# Patient Record
Sex: Male | Born: 1960 | Race: White | Hispanic: No | Marital: Single | Smoking: Current every day smoker
Health system: Southern US, Community
[De-identification: ages and names within clinical notes are randomized; demographics above are authoritative.]

## PROBLEM LIST (undated history)

## (undated) DIAGNOSIS — G629 Polyneuropathy, unspecified: Secondary | ICD-10-CM

## (undated) DIAGNOSIS — I2699 Other pulmonary embolism without acute cor pulmonale: Secondary | ICD-10-CM

## (undated) DIAGNOSIS — M1990 Unspecified osteoarthritis, unspecified site: Secondary | ICD-10-CM

## (undated) DIAGNOSIS — I10 Essential (primary) hypertension: Secondary | ICD-10-CM

## (undated) DIAGNOSIS — R011 Cardiac murmur, unspecified: Secondary | ICD-10-CM

## (undated) DIAGNOSIS — I82409 Acute embolism and thrombosis of unspecified deep veins of unspecified lower extremity: Secondary | ICD-10-CM

## (undated) DIAGNOSIS — F419 Anxiety disorder, unspecified: Secondary | ICD-10-CM

## (undated) DIAGNOSIS — K219 Gastro-esophageal reflux disease without esophagitis: Secondary | ICD-10-CM

## (undated) DIAGNOSIS — I4891 Unspecified atrial fibrillation: Secondary | ICD-10-CM

## (undated) DIAGNOSIS — I209 Angina pectoris, unspecified: Secondary | ICD-10-CM

## (undated) HISTORY — PX: HERNIA REPAIR: SHX51

## (undated) HISTORY — PX: CHOLECYSTECTOMY: SHX55

## (undated) HISTORY — PX: GANGLION CYST EXCISION: SHX1691

## (undated) HISTORY — DX: Unspecified atrial fibrillation: I48.91

## (undated) HISTORY — PX: OTHER SURGICAL HISTORY: SHX169

## (undated) HISTORY — PX: CARPAL TUNNEL RELEASE: SHX101

---

## 2008-03-26 ENCOUNTER — Encounter: Payer: Self-pay | Admitting: Pulmonary Disease

## 2008-03-27 ENCOUNTER — Encounter: Payer: Self-pay | Admitting: Pulmonary Disease

## 2008-09-22 ENCOUNTER — Encounter: Payer: Self-pay | Admitting: Pulmonary Disease

## 2008-09-27 ENCOUNTER — Ambulatory Visit: Payer: Self-pay | Admitting: Cardiovascular Disease

## 2008-09-27 ENCOUNTER — Ambulatory Visit: Payer: Self-pay | Admitting: Pulmonary Disease

## 2008-09-27 DIAGNOSIS — E785 Hyperlipidemia, unspecified: Secondary | ICD-10-CM

## 2008-09-27 DIAGNOSIS — I2699 Other pulmonary embolism without acute cor pulmonale: Secondary | ICD-10-CM | POA: Insufficient documentation

## 2008-09-30 ENCOUNTER — Encounter (INDEPENDENT_AMBULATORY_CARE_PROVIDER_SITE_OTHER): Payer: Self-pay | Admitting: *Deleted

## 2022-04-02 NOTE — Progress Notes (Addendum)
Anesthesia Review:  PCP: DR Baird Lyons sharp  LOV 03/2022  Cardiologist : ?  Chest x-ray : EKG : 04/10/22  Echo : Stress test: Cardiac Cath :  Activity level: can do a flight of stairs without difficulty  Sleep Study/ CPAP : none  Fasting Blood Sugar :      / Checks Blood Sugar -- times a day:   Blood Thinner/ Instructions /Last Dose: ASA / Instructions/ Last Dose :   0801AM ON 04/10/22- NO ANSWER AT [phone] NUMBER.  VOICE MAIOL NOT SET UP ON 250 NUMBER AND 685 NUMBER- NOT IN SERVICE.  PT NOT AT FRONT DESK IN ADMITTING.   PT was 30 minutes late for preop appt.   Hx of blood clots and 2 pulmonary emboli per pt not seen by cardiolgist in years.   Pt on eliquis and 81 mg Aspirin   PT had some chest paih on 04/04/2022 per pt - ? Heartburn  PT also describes at times- " nipples hurt"  PT stated at end of preop appt was nauseated this am after eating biscuit prior to arrival and owke up with sweat on bed.  PT states " I wonder if I have a sinus infections".  Blood pressure was 177/75 at preop appt and pulse was 126.  Ekg done.   Atrial flutter .  Leticia Clas made aware of above.  Kathi Ludwig Ward,PA saw pt and spoke with pt.   Pt instructed to call pharmacy at [phone] andgive them list of meds.  Reportd called to Charge Nurse in ED.  Instructed to bring to room 6 in ED.  PT transported to room 6 via wheelchair.  PT voices no complaints.  PT allowed to call fiancee.  Taken to ED .  Report given to nurse.  PT discharged from the ED after receivin a dose if IV Metoprolol.  PT understand s to followup with PCP.   CBC done 04/10/22 routed to Dr Charlann Boxer.,

## 2022-04-02 NOTE — Progress Notes (Signed)
DUE TO COVID-19 ONLY  2 VISITOR IS ALLOWED TO COME WITH YOU AND STAY IN THE WAITING ROOM ONLY DURING PRE OP AND PROCEDURE DAY OF SURGERY.  4  VISITOR  MAY VISIT WITH YOU AFTER SURGERY IN YOUR PRIVATE ROOM DURING VISITING HOURS ONLY! YOU MAY HAVE ONE PERSON SPEND THE NITE WITH YOU IN YOUR ROOM AFTER SURGERY.     Your procedure is scheduled on:   04/23/22   Report to [facility] Main  Entrance   Report to admitting at    1015             AM DO NOT BRING INSURANCE CARD, PICTURE ID OR WALLET DAY OF SURGERY.      Call this number if you have problems the morning of surgery [phone]    REMEMBER: NO  SOLID FOODS , CANDY, GUM OR MINTS AFTER [place] .       Marland Kitchen CLEAR LIQUIDS UNTIL      0945am          DAY OF SURGERY.      PLEASE FINISH ENSURE DRINK PER SURGEON ORDER  WHICH NEEDS TO BE COMPLETED AT       0945am    MORNING OF SURGERY.       CLEAR LIQUID DIET   Foods Allowed      WATER BLACK COFFEE ( SUGAR OK, NO MILK, CREAM OR CREAMER) REGULAR AND DECAF  TEA ( SUGAR OK NO MILK, CREAM, OR CREAMER) REGULAR AND DECAF  PLAIN JELLO ( NO RED)  FRUIT ICES ( NO RED, NO FRUIT PULP)  POPSICLES ( NO RED)  JUICE- APPLE, WHITE GRAPE AND WHITE CRANBERRY  SPORT DRINK LIKE GATORADE ( NO RED)  CLEAR BROTH ( VEGETABLE , CHICKEN OR BEEF)                                                                     BRUSH YOUR TEETH MORNING OF SURGERY AND RINSE YOUR MOUTH OUT, NO CHEWING GUM CANDY OR MINTS.     Take these medicines the morning of surgery with A SIP OF WATER:    DO NOT TAKE ANY DIABETIC MEDICATIONS DAY OF YOUR SURGERY                               You may not have any metal on your body including hair pins and              piercings  Do not wear jewelry, make-up, lotions, powders or perfumes, deodorant             Do not wear nail polish on your fingernails.              IF YOU ARE A MALE AND WANT TO SHAVE UNDER ARMS OR LEGS PRIOR TO SURGERY YOU MUST DO SO AT  LEAST 48 HOURS PRIOR TO SURGERY.              Men may shave face and neck.   Do not bring valuables to the hospital. [place].  Contacts,  dentures or bridgework may not be worn into surgery.  Leave suitcase in the car. After surgery it may be brought to your room.     Patients discharged the day of surgery will not be allowed to drive home. IF YOU ARE HAVING SURGERY AND GOING HOME THE SAME DAY, YOU MUST HAVE AN ADULT TO DRIVE YOU HOME AND BE WITH YOU FOR 24 HOURS. YOU MAY GO HOME BY TAXI OR UBER OR ORTHERWISE, BUT AN ADULT MUST ACCOMPANY YOU HOME AND STAY WITH YOU FOR 24 HOURS.                Please read over the following fact sheets you were given: _____________________________________________________________________  [facility] - Preparing for Surgery Before surgery, you can play an important role.  Because skin is not sterile, your skin needs to be as free of germs as possible.  You can reduce the number of germs on your skin by washing with CHG (chlorahexidine gluconate) soap before surgery.  CHG is an antiseptic cleaner which kills germs and bonds with the skin to continue killing germs even after washing. Please DO NOT use if you have an allergy to CHG or antibacterial soaps.  If your skin becomes reddened/irritated stop using the CHG and inform your nurse when you arrive at Short Stay. Do not shave (including legs and underarms) for at least 48 hours prior to the first CHG shower.  You may shave your face/neck. Please follow these instructions carefully:  1.  Shower with CHG Soap the night before surgery and the  morning of Surgery.  2.  If you choose to wash your hair, wash your hair first as usual with your  normal  shampoo.  3.  After you shampoo, rinse your hair and body thoroughly to remove the  shampoo.                           4.  Use CHG as you would any other liquid soap.  You can apply chg directly  to the skin and wash                        Gently with a scrungie or clean washcloth.  5.  Apply the CHG Soap to your body ONLY FROM THE NECK DOWN.   Do not use on face/ open                           Wound or open sores. Avoid contact with eyes, ears mouth and genitals (private parts).                       Wash face,  Genitals (private parts) with your normal soap.             6.  Wash thoroughly, paying special attention to the area where your surgery  will be performed.  7.  Thoroughly rinse your body with warm water from the neck down.  8.  DO NOT shower/wash with your normal soap after using and rinsing off  the CHG Soap.                9.  Pat yourself dry with a clean towel.            10.  Wear clean pajamas.            11.  Place  clean sheets on your bed the night of your first shower and do not  sleep with pets. Day of Surgery : Do not apply any lotions/deodorants the morning of surgery.  Please wear clean clothes to the hospital/surgery center.  FAILURE TO FOLLOW THESE INSTRUCTIONS MAY RESULT IN THE CANCELLATION OF YOUR SURGERY PATIENT SIGNATURE_________________________________  NURSE SIGNATURE__________________________________  ________________________________________________________________________

## 2022-04-10 ENCOUNTER — Encounter (HOSPITAL_COMMUNITY): Payer: Self-pay | Admitting: Physician Assistant

## 2022-04-10 ENCOUNTER — Other Ambulatory Visit: Payer: Self-pay

## 2022-04-10 ENCOUNTER — Encounter (HOSPITAL_COMMUNITY)
Admission: RE | Admit: 2022-04-10 | Discharge: 2022-04-10 | Disposition: A | Discharge status: Home / Self Care | Payer: BC Managed Care – PPO | Source: Ambulatory Visit | Attending: Orthopedic Surgery | Admitting: Orthopedic Surgery

## 2022-04-10 ENCOUNTER — Encounter (HOSPITAL_COMMUNITY): Payer: Self-pay

## 2022-04-10 ENCOUNTER — Emergency Department (HOSPITAL_COMMUNITY): Payer: BC Managed Care – PPO

## 2022-04-10 ENCOUNTER — Encounter (INDEPENDENT_AMBULATORY_CARE_PROVIDER_SITE_OTHER): Payer: Self-pay

## 2022-04-10 ENCOUNTER — Emergency Department (HOSPITAL_COMMUNITY)
Admission: EM | Admit: 2022-04-10 | Discharge: 2022-04-10 | Disposition: A | Discharge status: Home / Self Care | Payer: BC Managed Care – PPO | Attending: Emergency Medicine | Admitting: Emergency Medicine

## 2022-04-10 VITALS — BP 177/75 | HR 126 | Temp 98.6°F | Resp 16 | Ht 68.0 in | Wt 210.0 lb

## 2022-04-10 DIAGNOSIS — I10 Essential (primary) hypertension: Secondary | ICD-10-CM | POA: Diagnosis not present

## 2022-04-10 DIAGNOSIS — I4892 Unspecified atrial flutter: Secondary | ICD-10-CM | POA: Diagnosis present

## 2022-04-10 DIAGNOSIS — Z01818 Encounter for other preprocedural examination: Secondary | ICD-10-CM | POA: Insufficient documentation

## 2022-04-10 DIAGNOSIS — M1712 Unilateral primary osteoarthritis, left knee: Secondary | ICD-10-CM | POA: Insufficient documentation

## 2022-04-10 DIAGNOSIS — Z7901 Long term (current) use of anticoagulants: Secondary | ICD-10-CM | POA: Insufficient documentation

## 2022-04-10 DIAGNOSIS — I483 Typical atrial flutter: Secondary | ICD-10-CM | POA: Insufficient documentation

## 2022-04-10 HISTORY — DX: Gastro-esophageal reflux disease without esophagitis: K21.9

## 2022-04-10 HISTORY — DX: Unspecified osteoarthritis, unspecified site: M19.90

## 2022-04-10 LAB — CBC WITH DIFFERENTIAL/PLATELET
Abs Immature Granulocytes: 0.02 10*3/uL (ref 0.00–0.07)
Basophils Absolute: 0 10*3/uL (ref 0.0–0.1)
Basophils Relative: 1 %
Eosinophils Absolute: 0 10*3/uL (ref 0.0–0.5)
Eosinophils Relative: 0 %
HCT: 48.4 % (ref 39.0–52.0)
Hemoglobin: 16.5 g/dL (ref 13.0–17.0)
Immature Granulocytes: 0 %
Lymphocytes Relative: 17 %
Lymphs Abs: 0.9 10*3/uL (ref 0.7–4.0)
MCH: 30.7 pg (ref 26.0–34.0)
MCHC: 34.1 g/dL (ref 30.0–36.0)
MCV: 90.1 fL (ref 80.0–100.0)
Monocytes Absolute: 1 10*3/uL (ref 0.1–1.0)
Monocytes Relative: 18 %
Neutro Abs: 3.6 10*3/uL (ref 1.7–7.7)
Neutrophils Relative %: 64 %
Platelets: 113 10*3/uL — ABNORMAL LOW (ref 150–400)
RBC: 5.37 MIL/uL (ref 4.22–5.81)
RDW: 14.6 % (ref 11.5–15.5)
WBC: 5.6 10*3/uL (ref 4.0–10.5)
nRBC: 0 % (ref 0.0–0.2)

## 2022-04-10 LAB — BASIC METABOLIC PANEL
Anion gap: 10 (ref 5–15)
Anion gap: 10 (ref 5–15)
BUN: 20 mg/dL (ref 6–20)
BUN: 18 mg/dL (ref 6–20)
CO2: 30 mmol/L (ref 22–32)
CO2: 29 mmol/L (ref 22–32)
Calcium: 9.9 mg/dL (ref 8.9–10.3)
Calcium: 9.9 mg/dL (ref 8.9–10.3)
Chloride: 98 mmol/L (ref 98–111)
Chloride: 99 mmol/L (ref 98–111)
Creatinine, Ser: 1.12 mg/dL (ref 0.61–1.24)
Creatinine, Ser: 1.1 mg/dL (ref 0.61–1.24)
GFR, Estimated: >60 mL/min (ref >60)
GFR, Estimated: >60 mL/min (ref >60)
Glucose, Bld: 113 mg/dL — ABNORMAL HIGH (ref 70–99)
Glucose, Bld: 103 mg/dL — ABNORMAL HIGH (ref 70–99)
Potassium: 3.9 mmol/L (ref 3.5–5.1)
Potassium: 3.6 mmol/L (ref 3.5–5.1)
Sodium: 138 mmol/L (ref 135–145)
Sodium: 138 mmol/L (ref 135–145)

## 2022-04-10 LAB — CBC
HCT: 47.8 % (ref 39.0–52.0)
Hemoglobin: 16 g/dL (ref 13.0–17.0)
MCH: 30.4 pg (ref 26.0–34.0)
MCHC: 33.5 g/dL (ref 30.0–36.0)
MCV: 90.7 fL (ref 80.0–100.0)
Platelets: 110 10*3/uL — ABNORMAL LOW (ref 150–400)
RBC: 5.27 MIL/uL (ref 4.22–5.81)
RDW: 14.6 % (ref 11.5–15.5)
WBC: 5.4 10*3/uL (ref 4.0–10.5)
nRBC: 0 % (ref 0.0–0.2)

## 2022-04-10 LAB — SURGICAL PCR SCREEN
MRSA, PCR: NEGATIVE
Staphylococcus aureus: NEGATIVE

## 2022-04-10 LAB — T4, FREE: Free T4: 0.81 ng/dL (ref 0.61–1.12)

## 2022-04-10 LAB — TYPE AND SCREEN
ABO/RH(D): B POS
Antibody Screen: NEGATIVE

## 2022-04-10 LAB — TSH: TSH: 0.561 u[IU]/mL (ref 0.350–4.500)

## 2022-04-10 LAB — MAGNESIUM: Magnesium: 1.8 mg/dL (ref 1.7–2.4)

## 2022-04-10 MED ORDER — METOPROLOL TARTRATE 5 MG/5ML IV SOLN
5.0000 mg | Freq: Once | INTRAVENOUS | Status: AC
  Administered 2022-04-10: 5 mg via INTRAVENOUS
  Filled 2022-04-10: qty 5

## 2022-04-10 MED ORDER — SODIUM CHLORIDE 0.9 % IV BOLUS
1000.0000 mL | Freq: Once | INTRAVENOUS | Status: AC
  Administered 2022-04-10: 1000 mL via INTRAVENOUS

## 2022-04-10 NOTE — ED Provider Notes (Addendum)
[facility] DEPT Provider Note   CSN: [id number] Arrival date & time: 04/10/22  0934     History  Chief Complaint  Patient presents with   Atrial Flutter    Anthony Quinn is a 61 y.o. male.  61 yo M with a chief complaints of being told that he has atrial flutter.  The patient went to preop testing today because he has been having chronic knee pain and has planned knee replacement.  While there he had a screening EKG that showed that he was in atrial flutter with rates up into the 120s and was sent to the emergency department for evaluation.  He tells me that he has been having some palpitations but that is because he takes a stimulant every morning.  He also smokes.  He has developed some cough and congestion going on for the past couple days that he thinks is due to being out in the weather.  He has some pain over the left nipple that he thinks is due to irritation.  Denies overt chest discomfort.  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       Home Medications Prior to Admission medications   Not on File      Allergies    Patient has no allergy information on record.    Review of Systems   Review of Systems  Physical Exam Updated Vital Signs BP (!) 154/71   Pulse 72   Temp 99.4 F (37.4 C) (Oral)   Resp 16   Ht 5\' 10"  (1.778 m)   Wt 95.3 kg   SpO2 94%   BMI 30.13 kg/m  Physical Exam Vitals and nursing note reviewed.  Constitutional:      Appearance: He is well-developed.  HENT:     Head: Normocephalic and atraumatic.  Eyes:     Pupils: Pupils are equal, round, and reactive to light.  Neck:     Vascular: No JVD.  Cardiovascular:     Rate and Rhythm: Tachycardia present. Rhythm irregular.     Heart sounds: No murmur heard.   No friction rub. No gallop.  Pulmonary:     Effort: No respiratory distress.     Breath sounds: No wheezing.  Abdominal:      General: There is no distension.     Tenderness: There is no abdominal tenderness. There is no guarding or rebound.  Musculoskeletal:        General: Normal range of motion.     Cervical back: Normal range of motion and neck supple.  Skin:    Coloration: Skin is not pale.     Findings: No rash.  Neurological:     Mental Status: He is alert and oriented to person, place, and time.  Psychiatric:        Behavior: Behavior normal.    ED Results / Procedures / Treatments   Labs (all labs ordered are listed, but only abnormal results are displayed) Labs Reviewed  CBC WITH DIFFERENTIAL/PLATELET - Abnormal; Notable for the following components:      Result Value   Platelets 113 (*)    All other components within normal limits  BASIC METABOLIC PANEL - Abnormal; Notable for the following components:   Glucose, Bld 103 (*)    All other components within normal limits  MAGNESIUM  TSH  T4, FREE    EKG None  Radiology DG Chest Port 1  View  Result Date: 04/10/2022 CLINICAL DATA:  Hypertension and tachycardia.  Atrial flutter. EXAM: PORTABLE CHEST 1 VIEW COMPARISON:  None FINDINGS: The heart size and mediastinal contours are within normal limits. Both lungs are clear. The visualized skeletal structures are unremarkable. IMPRESSION: No active disease. Electronically Signed   By: Nelson Chimes M.D.   On: 04/10/2022 10:25    Procedures Procedures   Discussed smoking cessation with patient and was they were offerred resources to help stop.  Total time was 5 min CPT code 99406.     Medications Ordered in ED Medications  metoprolol tartrate (LOPRESSOR) injection 5 mg (5 mg Intravenous Given 04/10/22 1030)  sodium chloride 0.9 % bolus 1,000 mL (1,000 mLs Intravenous New Bag/Given 04/10/22 1030)    ED Course/ Medical Decision Making/ A&P                           Medical Decision Making Amount and/or Complexity of Data Reviewed Labs: ordered. Radiology: ordered. ECG/medicine tests:  ordered.  Risk Prescription drug management.   61 yo M with a chief complaints of incidental finding of atrial fibrillation.  This was found in the surgical center as a preop test.  He has been feeling a bit jittery but occurred after he smoked cigarettes and a stimulant for weight loss.  He does feel like he could feel like he has a blood clot in his lung but would not like any testing for it.  He tells me he is from [place] and would like to leave as soon as he could.  I offered troponin testing which she also is declining.  He is okay with me checking electrolytes.  We will give a bolus of IV fluids a dose of metoprolol reassess.  CBC without anemia, no significant electrolyte abnormality.  TSH is normal.  Mag normal.  Chest x-ray independently interpreted by me without focal infiltrate.  CHA2DS2/VAS Stroke Risk Points  Current as of 2 minutes ago     ? >= 2 Points: High Risk  1 - 1.99 Points: Medium Risk  0 Points: Low Risk    Last Change: N/A      Details    This score determines the patient's risk of having a stroke if the  patient has atrial fibrillation.       Points Metrics  0 Has Congestive Heart Failure:  No    Current as of 2 minutes ago  0 Has Vascular Disease:  No    Current as of 2 minutes ago  0 Has Hypertension:  No    Current as of 2 minutes ago  0 Age:  58    Current as of 2 minutes ago  0 Has Diabetes:  No    Current as of 2 minutes ago  0 Had Stroke:  No  Had TIA:  No  Had Thromboembolism:  No    Current as of 2 minutes ago  0 Male:  No    Current as of 2 minutes ago            11:04 AM:  I have discussed the diagnosis/risks/treatment options with the patient and family.  Evaluation and diagnostic testing in the emergency department does not suggest an emergent condition requiring admission or immediate intervention beyond what has been performed at this time.  They will follow up with  PCP. We also discussed returning to the ED immediately if new or  worsening sx occur. We discussed  the sx which are most concerning (e.g., sudden worsening pain, fever, inability to tolerate by mouth) that necessitate immediate return. Medications administered to the patient during their visit and any new prescriptions provided to the patient are listed below.  Medications given during this visit Medications  metoprolol tartrate (LOPRESSOR) injection 5 mg (5 mg Intravenous Given 04/10/22 1030)  sodium chloride 0.9 % bolus 1,000 mL (1,000 mLs Intravenous New Bag/Given 04/10/22 1030)     The patient appears reasonably screen and/or stabilized for discharge and I doubt any other medical condition or other [facility] requiring further screening, evaluation, or treatment in the ED at this time prior to discharge.          Final Clinical Impression(s) / ED Diagnoses Final diagnoses:  Typical atrial flutter ([place])    Rx / DC Orders ED Discharge Orders          Ordered    Ambulatory referral to Cardiology       Comments: If you have not heard from the Cardiology office within the next 72 hours please call [phone].   04/10/22 [place], [place], DO 04/10/22 [place], [place], DO 04/10/22 1105

## 2022-04-10 NOTE — ED Triage Notes (Addendum)
Pt brought over from surgery center. Pt was having a pre op consult today for knee surgery.   BP was elevated and pulse 120. Pt found to be in A.flutter.   Pt reports chest pain on the 25th but did not follow up. Today pt had nausea, scratchy throat, and sweats on his way to pre op consult.    A/Ox4 Ambulatory    Collected at surgery center: CBC  BMP  Hx: PE and DVT

## 2022-04-10 NOTE — Discharge Instructions (Signed)
It looks like you have converted on your own.  Either way typically they would have you see the cardiologist for this.  I placed a referral that they should call you to try and set up an appointment.  Please follow-up with your family doctor as well.  Return at any point you would like to be worked up for a blood clot in the lung.  Please take your anticoagulant as prescribed.

## 2022-04-11 ENCOUNTER — Telehealth: Payer: Self-pay | Admitting: *Deleted

## 2022-04-11 NOTE — Telephone Encounter (Signed)
Pt has NEW PT APPT 04/12/22 with Dr. Acie Fredrickson. I will forward notes to MD for upcoming appt. I have added need PRE OP CLEARANCE.     URGENT SEE NOTES: PT WAS SEEN IN THE OFFICE TODAY BY DR. Acie Fredrickson AS A NEW PT . I HAVE FAXED OVER DR. NASHER'S OV NOTES AS AN FYI. PER DR. Cathie Olden ASSESSMENT TODAY THE PT WILL NEED TO HAVE AN ECHOCARDIOGRAM AS WELL AS HE HAS BEEN REFERRED TO OUR [facility] CLINIC AS THE PT WILL NEED LOVENOX BRIDGING.   ONCE THE PT HAS BEEN CLEARED OUR OFFICE WILL BE SURE TO FAX OVER CLEARANCE NOTES ALONG WITH ANY MEDICATION RECOMMENDATIONS.

## 2022-04-11 NOTE — Telephone Encounter (Signed)
   Pre-operative Risk Assessment    Patient Name: Anthony Quinn  DOB: 11/07/1961 MRN: [id number]   PT IS GOING TO NEED A NEW PT APPT; WILL SEND A MESSAGE TO OUR CHART PREP TEAM TO REACH OUT TO THE PT WITH A NEW PT APPT. NOT SURE IF A REFERRAL HAS BEEN SENT TO OUR OFFICE. I WILL UPDATE THE REQUESTING OFFICE THE PT WILL NEED A NEW PT APPT BEFORE HE CAN BE CLEARED.   Request for Surgical Clearance    Procedure:   LEFT TOTAL KNEE ARTHROPLASTY  Date of Surgery:  Clearance 04/23/22                                 Surgeon:  DR. MATTHEW OLIN Surgeon's Group or Practice Name:  [facility] Phone number:  [phone] ATTN: Rosalva Ferron Fax number:  [phone]   Type of Clearance Requested:   - Medical  - Pharmacy:  Hold Apixaban (Eliquis)     Type of Anesthesia:  Spinal   Additional requests/questions:    Elpidio Anis   04/11/2022, 3:36 PM

## 2022-04-12 ENCOUNTER — Ambulatory Visit: Payer: BC Managed Care – PPO | Admitting: Cardiovascular Disease

## 2022-04-12 ENCOUNTER — Encounter: Payer: Self-pay | Admitting: Cardiovascular Disease

## 2022-04-12 ENCOUNTER — Telehealth: Payer: Self-pay | Admitting: Pharmacist

## 2022-04-12 VITALS — BP 150/78 | HR 86 | Ht 70.0 in | Wt 208.6 lb

## 2022-04-12 DIAGNOSIS — I480 Paroxysmal atrial fibrillation: Secondary | ICD-10-CM | POA: Insufficient documentation

## 2022-04-12 DIAGNOSIS — I2782 Chronic pulmonary embolism: Secondary | ICD-10-CM

## 2022-04-12 DIAGNOSIS — I4891 Unspecified atrial fibrillation: Secondary | ICD-10-CM | POA: Insufficient documentation

## 2022-04-12 MED ORDER — METOPROLOL TARTRATE 25 MG PO TABS: 25.0000 mg | ORAL_TABLET | Freq: Two times a day (BID) | ORAL | 3 refills | Status: DC

## 2022-04-12 NOTE — Patient Instructions (Addendum)
Medication Instructions:  Your physician has recommended you make the following change in your medication:   1) START Metoprolol tartrate 25mg  twice daily  *If you need a refill on your cardiac medications before your next appointment, please call your pharmacy*  Lab Work: NONE  Testing/Procedures: Your physician has requested that you have an echocardiogram as soon as possible prior to knee surgery 04/23/22. Echocardiography is a painless test that uses sound waves to create images of your heart. It provides your doctor with information about the size and shape of your heart and how well your heart's chambers and valves are working. This procedure takes approximately one hour. There are no restrictions for this procedure.  Your physician has requested you have a sleep study performed. This will be performed at [facility], they will call you to schedule an appointment.  Follow-Up: At [facility], you and your health needs are our priority.  As part of our continuing mission to provide you with exceptional heart care, we have created designated Provider Care Teams.  These Care Teams include your primary Cardiologist (physician) and Advanced Practice Providers (APPs -  Physician Assistants and Nurse Practitioners) who all work together to provide you with the care you need, when you need it.  Your next appointment:   3 month(s)  The format for your next appointment:   In Person  Provider:   [facility], PA-C    Important Information About Sugar

## 2022-04-12 NOTE — Progress Notes (Signed)
Cardiology Office Note:    Date:  04/12/2022   ID:  Anthony Quinn, DOB [birth year], MRN [id number]  PCP:  Anthony Quinn, Casey, NP   [facility] Providers Cardiologist:  Mone Commisso       Referring MD: Anthony Quinn, Casey, NP   Chief Complaint  Patient presents with   Atrial Fibrillation         History of Present Illness:    Anthony Quinn is a 61 y.o. male with a hx of PAF He was seen in for pre-op for Knee surgery . Was incidentally found to have afib. He felt poorly that morning   Was sent to the ER ,   received metoprolol 5 mg IV  Follow up ECG was NSR   Hx of pulmonary embolus - has been on eliquis since 2014 Has had 2 pulmonary emboli Will need to hold his eliquis for 2-3 days prior to his knee surgery He has been bridged with Lovenox in the past prior to procedures   No cp or dyspnea with walking .  Uses crutches on occasion  [place]   [place] .  Is in Afib today  Eats fast food frequently  [employer] No much alcohol Lots of processed meats   Snores,  does not sleep well.   I suspect he has OSA    Past Medical History:  Diagnosis Date   Arthritis    Atrial fibrillation (HCC)    GERD (gastroesophageal reflux disease)     No past surgical history on file.  Current Medications: Current Meds  Medication Sig   ADDERALL XR 15 MG 24 hr capsule Take 1 capsule by mouth every morning.   DEPO-TESTOSTERONE 200 MG/ML injection Inject into the muscle.   divalproex (DEPAKOTE ER) 250 MG 24 hr tablet Take 250 mg by mouth daily.   ELIQUIS 5 MG TABS tablet Take 5 mg by mouth 2 (two) times daily.   lisinopril (ZESTRIL) 20 MG tablet Take 20 mg by mouth 2 (two) times daily.   metoprolol tartrate (LOPRESSOR) 25 MG tablet Take 1 tablet (25 mg total) by mouth 2 (two) times daily.   sildenafil (VIAGRA) 100 MG tablet Take by mouth.     Allergies:   Patient has no allergy information on record.   Social History   Socioeconomic History   Marital status: Single    Spouse  name: Not on file   Number of children: Not on file   Years of education: Not on file   Highest education level: Not on file  Occupational History   Not on file  Tobacco Use   Smoking status: Every Day    Packs/day: 0.50    Types: Cigarettes   Smokeless tobacco: Former  Building services engineerVaping Use   Vaping Use: Never used  Substance and Sexual Activity   Alcohol use: Yes    Comment: occas   Drug use: Never   Sexual activity: Not on file  Other Topics Concern   Not on file  Social History Narrative   Not on file   Social Determinants of Health   Financial Resource Strain: Not on file  Food Insecurity: Not on file  Transportation Needs: Not on file  Physical Activity: Not on file  Stress: Not on file  Social Connections: Not on file     Family History: The patient's family history is not on file.  ROS:   Please see the history of present illness.     All other systems reviewed and are negative.  EKGs/Labs/Other Studies Reviewed:    The following studies were reviewed today:   EKG:   Apr 10, 2022: Initial EKG shows atrial fibrillation.  Follow-up EKG shows normal sinus rhythm.  Recent Labs: 04/10/2022: BUN 18; Creatinine, Ser 1.10; Hemoglobin 16.5; Magnesium 1.8; Platelets 113; Potassium 3.6; Sodium 138; TSH 0.561  Recent Lipid Panel No results found for: CHOL, TRIG, HDL, CHOLHDL, VLDL, LDLCALC, LDLDIRECT   Risk Assessment/Calculations:    CHA2DS2-VASc Score = 1  This indicates a 0.6% annual risk of stroke. The patient's score is based upon: CHF History: 0 HTN History: 1 Diabetes History: 0 Stroke History: 0 Vascular Disease History: 0 Age Score: 0 Gender Score: 0           Physical Exam:    VS:  BP (!) 150/78   Pulse 86   Ht 5\' 10"  (1.778 m)   Wt 208 lb 9.6 oz (94.6 kg)   SpO2 98%   BMI 29.93 kg/m     Wt Readings from Last 3 Encounters:  04/12/22 208 lb 9.6 oz (94.6 kg)  04/10/22 210 lb (95.3 kg)  04/10/22 210 lb (95.3 kg)     GEN:  Well nourished,  well developed in no acute distress HEENT: Normal NECK: No JVD; No carotid bruits LYMPHATICS: No lymphadenopathy CARDIAC: Irreg. Irreg.  RESPIRATORY:  Clear to auscultation without rales, wheezing or rhonchi  ABDOMEN: Soft, non-tender, non-distended MUSCULOSKELETAL:  No edema; No deformity  SKIN: Warm and dry NEUROLOGIC:  Alert and oriented x 3 PSYCHIATRIC:  Normal affect   ASSESSMENT:    1. Chronic pulmonary embolism without acute cor pulmonale, unspecified pulmonary embolism type (HCC)   2. Paroxysmal atrial fibrillation (HCC)    PLAN:    In order of problems listed above:  Paroxysmal atrial fibrillation: [name] is  seen today for further evaluation and management of his paroxysmal atrial fibrillation.  He was seen in the preoperative clinic prior to knee surgery last week and was found to have atrial fibrillation.  He was sent to the emergency room.  They gave him IV metoprolol and a follow-up EKG showed normal sinus rhythm.  He cannot tell that his heart rate is irregular.  He did note that he was not feeling well the morning of his visit to the preop clinic.  He likely has obstructive sleep apnea.  He notes that he snores.  He does not feel rested in the morning when he wakes up.  He is on Eliquis already for recurrent pulmonary emboli.  At this point I would like to get an echocardiogram for further assessment of his LV function.  He had labs drawn in the emergency room several days ago and his TSH is within normal limits.  His electrolytes are normal.  We will get a sleep study.  Based on his history of recurrent pulmonary emboli, I would recommend that we bridge with Lovenox while he is off the Eliquis.  Dr. Thereasa Distance  has recommended that he hold his Eliquis for 3 days prior to his surgery.   2.  Recurrent pulmonary emboli: [name] has a history of recurrent pulmonary emboli.  From what I can tell these sound like they have were unprovoked.  I think he needs lifelong  anticoagulation. We will anticipate bridging with Lovenox prior to his knee surgery.  3.  Preoperative evaluation prior to knee surgery.  I would like to get an echocardiogram to assess his LV function.  He does not have any angina.  I suspect that he will  be at low risk but I would like to see his echocardiogram prior to making final determination prior to his surgery.  We will also send him to our Pharm.D. anticoagulation clinic for dosing recommendations for Lovenox therapy.  Follow up with [place] in 3 months    Medication Adjustments/Labs and Tests Ordered: Current medicines are reviewed at length with the patient today.  Concerns regarding medicines are outlined above.  Orders Placed This Encounter  Procedures   Ambulatory referral to Anticoagulation Monitoring   AMB Referral to [facility] Pharm-D   ECHOCARDIOGRAM COMPLETE   Split night study   Meds ordered this encounter  Medications   metoprolol tartrate (LOPRESSOR) 25 MG tablet    Sig: Take 1 tablet (25 mg total) by mouth 2 (two) times daily.    Dispense:  180 tablet    Refill:  3    Patient Instructions  Medication Instructions:  Your physician has recommended you make the following change in your medication:   1) START Metoprolol tartrate 25mg  twice daily  *If you need a refill on your cardiac medications before your next appointment, please call your pharmacy*  Lab Work: NONE  Testing/Procedures: Your physician has requested that you have an echocardiogram as soon as possible prior to knee surgery 04/23/22. Echocardiography is a painless test that uses sound waves to create images of your heart. It provides your doctor with information about the size and shape of your heart and how well your heart's chambers and valves are working. This procedure takes approximately one hour. There are no restrictions for this procedure.  Your physician has requested you have a sleep study performed. This will be performed at [facility], they will call you to schedule an appointment.  Follow-Up: At [facility], you and your health needs are our priority.  As part of our continuing mission to provide you with exceptional heart care, we have created designated Provider Care Teams.  These Care Teams include your primary Cardiologist (physician) and Advanced Practice Providers (APPs -  Physician Assistants and Nurse Practitioners) who all work together to provide you with the care you need, when you need it.  Your next appointment:   3 month(s)  The format for your next appointment:   In Person  Provider:   [facility], PA-C    Important Information About Sugar         Signed, Tereso Newcomer, MD  04/12/2022 5:33 PM    [facility]

## 2022-04-12 NOTE — Telephone Encounter (Addendum)
Pt requires 3 day Eliquis hold with Lovenox bridge prior to upcoming TKA on 6/13 per Dr Elease Hashimoto. Will dose 1.5mg /kg once daily = 150mg  once daily rounding to nearest syringe size. Renal function and platelet count normal. Pt typically takes Eliquis between 7-9am and evening dose around 11pm. Procedure scheduled for 12:40pm on 6/13.  6/9: Last dose of Eliquis in the AM. Inject Lovenox 150mg  at 11 PM. 6/10: Inject Lovenox 150mg  SQ at 11 PM. No Eliquis. 6/11: Inject Lovenox 150mg  SQ at 11 PM. No Eliquis. 6/12: No Lovenox, no Eliquis. 6/13: Procedure day. No Lovenox, no Eliquis. 6/14: Resume Eliquis this morning or as directed by MD performing procedure.  Called pt to discuss bridge as he lives in to see if he wishes to come to clinic to review Lovenox instructions or review on the phone and can send via MyChart. Pt has used Lovenox in the past and is familiar with injections. Reviewed instructions over the phone, sent in rx to pharmacy, and sent instructions via MyChart for pt as well.

## 2022-04-15 ENCOUNTER — Encounter: Payer: Self-pay | Admitting: Pharmacist

## 2022-04-15 MED ORDER — ENOXAPARIN SODIUM 150 MG/ML IJ SOSY: 150.0000 mg | PREFILLED_SYRINGE | INTRAMUSCULAR | 0 refills | Status: DC

## 2022-04-22 ENCOUNTER — Other Ambulatory Visit: Payer: BC Managed Care – PPO

## 2022-04-22 NOTE — Telephone Encounter (Addendum)
Shanda Bumps Ward, PAC contacted our office through secure chat in regard to if the pt was to have his echo today. I stated the pt cancelled said he was sick. Jessica Ward, [facility] states she called the requesting to office to let them know pt cancelled his echo. I will forward these notes as FYI to requesting office as well.   Per the cancellation notes the pt states he will call back to reschedule echo.   Per Dr. Melburn Popper, pt will need echo before clearing the pt.

## 2022-04-23 ENCOUNTER — Ambulatory Visit (HOSPITAL_COMMUNITY)
Admission: RE | Admit: 2022-04-23 | Payer: Worker's Compensation | Source: Home / Self Care | Admitting: Orthopedic Surgery

## 2022-04-23 ENCOUNTER — Encounter (HOSPITAL_COMMUNITY): Admission: RE | Payer: Self-pay | Source: Home / Self Care

## 2022-04-23 DIAGNOSIS — M1712 Unilateral primary osteoarthritis, left knee: Secondary | ICD-10-CM

## 2022-04-23 SURGERY — ARTHROPLASTY, KNEE, TOTAL
Anesthesia: Spinal | Site: Knee | Laterality: Left

## 2022-05-13 ENCOUNTER — Ambulatory Visit (INDEPENDENT_AMBULATORY_CARE_PROVIDER_SITE_OTHER): Payer: BC Managed Care – PPO

## 2022-05-13 DIAGNOSIS — I480 Paroxysmal atrial fibrillation: Secondary | ICD-10-CM | POA: Diagnosis not present

## 2022-05-13 LAB — ECHOCARDIOGRAM COMPLETE
AR max vel: 2.25 cm2
AV Area VTI: 2.31 cm2
AV Area mean vel: 2.38 cm2
AV Mean grad: 13.3 mmHg
AV Peak grad: 24.9 mmHg
Ao pk vel: 2.5 m/s
Area-P 1/2: 2.74 cm2
Calc EF: 70.6 %
MV M vel: 4.72 m/s
MV Peak grad: 89.2 mmHg
P 1/2 time: 681 msec
S' Lateral: 2.02 cm
Single Plane A2C EF: 79.4 %
Single Plane A4C EF: 62.5 %

## 2022-05-20 NOTE — Telephone Encounter (Signed)
Patient has normal left ventricular systolic function.  He has mild mitral regurgitation and mild aortic insufficiency.  He has atrial fibrillation.  His rate is fairly well controlled.  He is on anticoagulation for both a pulmonary embolus as well as atrial fibrillation.  He is at low risk for his upcoming orthopedic surgery.  Since he had a pulmonary embolus I would suggest that he have bridging of his Eliquis with Lovenox.  We will ask the pharmacist to help [place] with the bridging dose and timing.      Kristeen Miss, MD  05/20/2022 5:11 PM    [facility] [address] [place],  [address] [place], [place]  [address] Phone: [phone]; Fax: [phone]

## 2022-05-21 NOTE — Telephone Encounter (Signed)
I s/w Rosalva Ferron through secure chat. At this point we will fax over notes only as FYI the pt has been cleared, though we will await a new surgery date so that we may set up lovenox bridge.

## 2022-05-21 NOTE — Telephone Encounter (Signed)
Left message for Rosalva Ferron, [facility] surgery scheduler for Dr. Charlann Boxer. See previous notes . Left message she can call the pharm-d with new date for surgery [phone].

## 2022-05-21 NOTE — Telephone Encounter (Signed)
I have sent a message through secure chat to Rosalva Ferron asking if they have a new surgery date. We will need to now new date in order to set up Lovenox bridge. I will fax over these notes as FYI as well.

## 2022-05-21 NOTE — Telephone Encounter (Signed)
Once knee surgery is rescheduled, please have patient call us at [phone] to let us know so that we can provide him updated instructions for his lovenox bridge.

## 2022-07-04 NOTE — Patient Instructions (Addendum)
SURGICAL WAITING ROOM VISITATION Patients having surgery or a procedure may have no more than 2 support people in the waiting area - these visitors may rotate.   Children under the age of 29 must have an adult with them who is not the patient. If the patient needs to stay at the hospital during part of their recovery, the visitor guidelines for inpatient rooms apply. Pre-op nurse will coordinate an appropriate time for 1 support person to accompany patient in pre-op.  This support person may not rotate.    Please refer to the [facility] website for the visitor guidelines for Inpatients (after your surgery is over and you are in a regular room).    Your procedure is scheduled on: 07/16/22   Report to [facility] Main Entrance    Report to admitting at 1:15 PM   Call this number if you have problems the morning of surgery [phone]   Do not eat food :After Midnight.   After Midnight you may have the following liquids until 12:45 PM DAY OF SURGERY  Water Non-Citrus Juices (without pulp, NO RED) Carbonated Beverages Black Coffee (NO MILK/CREAM OR CREAMERS, sugar ok)  Clear Tea (NO MILK/CREAM OR CREAMERS, sugar ok) regular and decaf                             Plain Jell-O (NO RED)                                           Fruit ices (not with fruit pulp, NO RED)                                     Popsicles (NO RED)                                                               Sports drinks like Gatorade (NO RED)     The day of surgery:  Drink ONE (1) Pre-Surgery Clear Ensure at 12:45 PM the morning of surgery. Drink in one sitting. Do not sip.  This drink was given to you during your hospital  pre-op appointment visit. Nothing else to drink after completing the  Pre-Surgery Ensure.          If you have questions, please contact your surgeon's office.   FOLLOW BOWEL PREP AND ANY ADDITIONAL PRE OP INSTRUCTIONS YOU RECEIVED FROM YOUR SURGEON'S OFFICE!!!     Oral  Hygiene is also important to reduce your risk of infection.                                    Remember - BRUSH YOUR TEETH THE MORNING OF SURGERY WITH YOUR REGULAR TOOTHPASTE   Do NOT smoke after Midnight   Take these medicines the morning of surgery with A SIP OF WATER: Metoprolol   These are anesthesia recommendations for holding your anticoagulants.  Please contact your prescribing physician to confirm IF it is safe to hold your  anticoagulants for this length of time.   Eliquis Apixaban   72 hours   Xarelto Rivaroxaban   72 hours  Plavix Clopidogrel   120 hours  Pletal Cilostazol   120 hours                                You may not have any metal on your body including jewelry, and body piercing             Do not wear lotions, powders, cologne, or deodorant              Men may shave face and neck.   Do not bring valuables to the hospital. [facility] IS NOT             RESPONSIBLE   FOR VALUABLES.   Bring small overnight bag day of surgery.   DO NOT BRING YOUR HOME MEDICATIONS TO THE HOSPITAL. PHARMACY WILL DISPENSE MEDICATIONS LISTED ON YOUR MEDICATION LIST TO YOU DURING YOUR ADMISSION IN THE HOSPITAL!               Please read over the following fact sheets you were given: IF YOU HAVE QUESTIONS ABOUT YOUR PRE-OP INSTRUCTIONS PLEASE CALL [phone]- [facility] - Preparing for Surgery Before surgery, you can play an important role.  Because skin is not sterile, your skin needs to be as free of germs as possible.  You can reduce the number of germs on your skin by washing with CHG (chlorahexidine gluconate) soap before surgery.  CHG is an antiseptic cleaner which kills germs and bonds with the skin to continue killing germs even after washing. Please DO NOT use if you have an allergy to CHG or antibacterial soaps.  If your skin becomes reddened/irritated stop using the CHG and inform your nurse when you arrive at Short Stay. Do not shave (including legs and  underarms) for at least 48 hours prior to the first CHG shower.  You may shave your face/neck.  Please follow these instructions carefully:  1.  Shower with CHG Soap the night before surgery and the  morning of surgery.  2.  If you choose to wash your hair, wash your hair first as usual with your normal  shampoo.  3.  After you shampoo, rinse your hair and body thoroughly to remove the shampoo.                             4.  Use CHG as you would any other liquid soap.  You can apply chg directly to the skin and wash.  Gently with a scrungie or clean washcloth.  5.  Apply the CHG Soap to your body ONLY FROM THE NECK DOWN.   Do   not use on face/ open                           Wound or open sores. Avoid contact with eyes, ears mouth and   genitals (private parts).                       Wash face,  Genitals (private parts) with your normal soap.             6.  Wash thoroughly, paying special attention to the area where your  surgery  will be performed.  7.  Thoroughly rinse your body with warm water from the neck down.  8.  DO NOT shower/wash with your normal soap after using and rinsing off the CHG Soap.                9.  Pat yourself dry with a clean towel.            10.  Wear clean pajamas.            11.  Place clean sheets on your bed the night of your first shower and do not  sleep with pets. Day of Surgery : Do not apply any lotions/deodorants the morning of surgery.  Please wear clean clothes to the hospital/surgery center.  FAILURE TO FOLLOW THESE INSTRUCTIONS MAY RESULT IN THE CANCELLATION OF YOUR SURGERY  PATIENT SIGNATURE_________________________________  NURSE SIGNATURE__________________________________  ________________________________________________________________________   Rogelia Mire  An incentive spirometer is a tool that can help keep your lungs clear and active. This tool measures how well you are filling your lungs with each breath. Taking long deep  breaths may help reverse or decrease the chance of developing breathing (pulmonary) problems (especially infection) following: A long period of time when you are unable to move or be active. BEFORE THE PROCEDURE  If the spirometer includes an indicator to show your best effort, your nurse or respiratory therapist will set it to a desired goal. If possible, sit up straight or lean slightly forward. Try not to slouch. Hold the incentive spirometer in an upright position. INSTRUCTIONS FOR USE  Sit on the edge of your bed if possible, or sit up as far as you can in bed or on a chair. Hold the incentive spirometer in an upright position. Breathe out normally. Place the mouthpiece in your mouth and seal your lips tightly around it. Breathe in slowly and as deeply as possible, raising the piston or the ball toward the top of the column. Hold your breath for 3-5 seconds or for as long as possible. Allow the piston or ball to fall to the bottom of the column. Remove the mouthpiece from your mouth and breathe out normally. Rest for a few seconds and repeat Steps 1 through 7 at least 10 times every 1-2 hours when you are awake. Take your time and take a few normal breaths between deep breaths. The spirometer may include an indicator to show your best effort. Use the indicator as a goal to work toward during each repetition. After each set of 10 deep breaths, practice coughing to be sure your lungs are clear. If you have an incision (the cut made at the time of surgery), support your incision when coughing by placing a pillow or rolled up towels firmly against it. Once you are able to get out of bed, walk around indoors and cough well. You may stop using the incentive spirometer when instructed by your caregiver.  RISKS AND COMPLICATIONS Take your time so you do not get dizzy or light-headed. If you are in pain, you may need to take or ask for pain medication before doing incentive spirometry. It is harder  to take a deep breath if you are having pain. AFTER USE Rest and breathe slowly and easily. It can be helpful to keep track of a log of your progress. Your caregiver can provide you with a simple table to help with this. If you are using the spirometer at home, follow these instructions: SEEK MEDICAL CARE IF:  You are having difficultly using the spirometer. You have trouble using the spirometer as often as instructed. Your pain medication is not giving enough relief while using the spirometer. You develop fever of 100.5 F (38.1 C) or higher. SEEK IMMEDIATE MEDICAL CARE IF:  You cough up bloody sputum that had not been present before. You develop fever of 102 F (38.9 C) or greater. You develop worsening pain at or near the incision site. MAKE SURE YOU:  Understand these instructions. Will watch your condition. Will get help right away if you are not doing well or get worse. Document Released: 03/10/2007 Document Revised: 01/20/2012 Document Reviewed: 05/11/2007 [facility] Patient Information 2014 [place], [place].   ________________________________________________________________________

## 2022-07-04 NOTE — Progress Notes (Addendum)
COVID Vaccine Completed: no  Date of COVID positive in last 90 days: no  PCP - Rema Jasmine, NP Cardiologist - Laqueta Carina, MD  Chest x-ray - 04/10/22 Epic EKG - 04/15/22 Epic Stress Test - n/a ECHO - 05/13/22 Epic Cardiac Cath - n/a Pacemaker/ICD device last checked: n/a Spinal Cord Stimulator: n/a  Bowel Prep - no  Sleep Study - n/a CPAP -   Fasting Blood Sugar - n/a Checks Blood Sugar _____ times a day  Blood Thinner Instructions: Eliquis, stop 3 days before. Lovenox bridge. Patient reports he is going to call cardiologist to get new instructions  Aspirin Instructions:  ASA 81, hold 5-7 days, patient is going to call to confirm Last Dose:  Activity level: Can go up a flight of stairs and perform activities of daily living without stopping and without symptoms of chest pain or shortness of breath.    Anesthesia review: a fib, PE, cardiac clearance?  Patient denies shortness of breath, fever, cough and chest pain at PAT appointment  Patient verbalized understanding of instructions that were given to them at the PAT appointment. Patient was also instructed that they will need to review over the PAT instructions again at home before surgery.

## 2022-07-05 ENCOUNTER — Encounter (HOSPITAL_COMMUNITY)
Admission: RE | Admit: 2022-07-05 | Discharge: 2022-07-05 | Disposition: A | Discharge status: Home / Self Care | Payer: No Typology Code available for payment source | Source: Ambulatory Visit | Attending: Orthopedic Surgery | Admitting: Orthopedic Surgery

## 2022-07-05 ENCOUNTER — Encounter (HOSPITAL_COMMUNITY): Payer: Self-pay

## 2022-07-05 VITALS — BP 125/63 | HR 62 | Temp 98.6°F | Resp 16 | Ht 70.0 in | Wt 203.0 lb

## 2022-07-05 DIAGNOSIS — Z01818 Encounter for other preprocedural examination: Secondary | ICD-10-CM

## 2022-07-05 DIAGNOSIS — I4891 Unspecified atrial fibrillation: Secondary | ICD-10-CM | POA: Insufficient documentation

## 2022-07-05 DIAGNOSIS — F17200 Nicotine dependence, unspecified, uncomplicated: Secondary | ICD-10-CM | POA: Insufficient documentation

## 2022-07-05 DIAGNOSIS — I480 Paroxysmal atrial fibrillation: Secondary | ICD-10-CM

## 2022-07-05 DIAGNOSIS — M1712 Unilateral primary osteoarthritis, left knee: Secondary | ICD-10-CM | POA: Diagnosis not present

## 2022-07-05 DIAGNOSIS — Z7901 Long term (current) use of anticoagulants: Secondary | ICD-10-CM | POA: Insufficient documentation

## 2022-07-05 DIAGNOSIS — Z86711 Personal history of pulmonary embolism: Secondary | ICD-10-CM | POA: Insufficient documentation

## 2022-07-05 DIAGNOSIS — I340 Nonrheumatic mitral (valve) insufficiency: Secondary | ICD-10-CM | POA: Insufficient documentation

## 2022-07-05 HISTORY — DX: Essential (primary) hypertension: I10

## 2022-07-05 HISTORY — DX: Cardiac murmur, unspecified: R01.1

## 2022-07-05 HISTORY — DX: Other pulmonary embolism without acute cor pulmonale: I26.99

## 2022-07-05 HISTORY — DX: Acute embolism and thrombosis of unspecified deep veins of unspecified lower extremity: I82.409

## 2022-07-05 HISTORY — DX: Angina pectoris, unspecified: I20.9

## 2022-07-05 HISTORY — DX: Anxiety disorder, unspecified: F41.9

## 2022-07-05 HISTORY — DX: Polyneuropathy, unspecified: G62.9

## 2022-07-05 LAB — CBC
HCT: 47.5 % (ref 39.0–52.0)
Hemoglobin: 15.6 g/dL (ref 13.0–17.0)
MCH: 31 pg (ref 26.0–34.0)
MCHC: 32.8 g/dL (ref 30.0–36.0)
MCV: 94.2 fL (ref 80.0–100.0)
Platelets: 159 10*3/uL (ref 150–400)
RBC: 5.04 MIL/uL (ref 4.22–5.81)
RDW: 16.1 % — ABNORMAL HIGH (ref 11.5–15.5)
WBC: 8.9 10*3/uL (ref 4.0–10.5)
nRBC: 0 % (ref 0.0–0.2)

## 2022-07-05 LAB — TYPE AND SCREEN
ABO/RH(D): B POS
Antibody Screen: NEGATIVE

## 2022-07-05 LAB — SURGICAL PCR SCREEN
MRSA, PCR: NEGATIVE
Staphylococcus aureus: NEGATIVE

## 2022-07-05 LAB — BASIC METABOLIC PANEL
Anion gap: 9 (ref 5–15)
BUN: 19 mg/dL (ref 6–20)
CO2: 31 mmol/L (ref 22–32)
Calcium: 9.5 mg/dL (ref 8.9–10.3)
Chloride: 99 mmol/L (ref 98–111)
Creatinine, Ser: 1.07 mg/dL (ref 0.61–1.24)
GFR, Estimated: >60 mL/min (ref >60)
Glucose, Bld: 107 mg/dL — ABNORMAL HIGH (ref 70–99)
Potassium: 4.1 mmol/L (ref 3.5–5.1)
Sodium: 139 mmol/L (ref 135–145)

## 2022-07-08 ENCOUNTER — Encounter: Payer: Self-pay | Admitting: Pharmacist

## 2022-07-08 NOTE — Progress Notes (Signed)
Anesthesia Chart Review   Case: [id number] Date/Time: 07/16/22 1530   Procedure: TOTAL KNEE ARTHROPLASTY (Left: Knee)   Anesthesia type: Spinal   Pre-op diagnosis: Left knee osteoarthritis   Location: [facility] ROOM 09 / [facility]   Surgeons: Durene Romans, MD       DISCUSSION:61 y.o. smoker with h/o HTN, PE, DVT, atrial fibrillation (on Eliquis), mild AS, left knee OA scheduled for above procedure 07/16/2022 with Dr. Durene Romans.   Pt seen by cardiology 04/12/2022. Per Dr. Elease Hashimoto, "Patient has normal left ventricular systolic function.  He has mild mitral regurgitation and mild aortic insufficiency.   He has atrial fibrillation.  His rate is fairly well controlled.  He is on anticoagulation for both a pulmonary embolus as well as atrial fibrillation.   He is at low risk for his upcoming orthopedic surgery.  Since he had a pulmonary embolus I would suggest that he have bridging of his Eliquis with Lovenox.  We will ask the pharmacist to help [place] with the bridging dose and timing."  Pt advised to hold Eliquis three days, instructions for Lovenox bridge given.   Anticipate pt can proceed with planned procedure barring acute status change.   VS: BP 125/63   Pulse 62   Temp 37 C (Oral)   Resp 16   Ht 5\' 10"  (1.778 m)   Wt 92.1 kg   SpO2 98%   BMI 29.13 kg/m   PROVIDERS: , NP is PCP  Rema Jasmine, MD is Cardiologist  LABS: Labs reviewed: Acceptable for surgery. (all labs ordered are listed, but only abnormal results are displayed)  Labs Reviewed  BASIC METABOLIC PANEL - Abnormal; Notable for the following components:      Result Value   Glucose, Bld 107 (*)    All other components within normal limits  CBC - Abnormal; Notable for the following components:   RDW 16.1 (*)    All other components within normal limits  SURGICAL PCR SCREEN  TYPE AND SCREEN     IMAGES:   EKG:   CV: Echo 05/13/2022 1. Left ventricular ejection fraction, by estimation, is 65 to 70%. The   left ventricle has normal function. The left ventricle has no regional  wall motion abnormalities. There is mild left ventricular hypertrophy.  Left ventricular diastolic parameters  are consistent with Grade I diastolic dysfunction (impaired relaxation).  The average left ventricular global longitudinal strain is -24.6 %. The  global longitudinal strain is normal.   2. Right ventricular systolic function is normal. The right ventricular  size is normal. There is normal pulmonary artery systolic pressure. The  estimated right ventricular systolic pressure is 31.5 mmHg.   3. The mitral valve is grossly normal. Mild mitral valve regurgitation.   4. The aortic valve has an indeterminant number of cusps, cannot  completely exclude bicupid morphology. There is moderate calcification of  the aortic valve. Aortic valve regurgitation is mild. Moderately sclerotic  to mildly stenotic aortic valve. Aortic   regurgitation PHT measures 681 msec. Aortic valve mean gradient measures  13.2 mmHg. Aortic valve Vmax measures 2.50 m/s. Dimentionless index 0.67.   5. The inferior vena cava is normal in size with greater than 50%  respiratory variability, suggesting right atrial pressure of 3 mmHg.  Past Medical History:  Diagnosis Date   Anginal pain (HCC)    Anxiety    Arthritis    Atrial fibrillation (HCC)    DVT (deep venous thrombosis) (HCC)    GERD (gastroesophageal reflux  disease)    Heart murmur    Hypertension    Neuropathy    PE (pulmonary thromboembolism) (HCC)     Past Surgical History:  Procedure Laterality Date   CARPAL TUNNEL RELEASE Right    CHOLECYSTECTOMY     GANGLION CYST EXCISION Right    HERNIA REPAIR     right arm surgery     x3    MEDICATIONS:  ADDERALL XR 15 MG 24 hr capsule   aspirin EC 81 MG tablet   DEPO-TESTOSTERONE 200 MG/ML injection   divalproex (DEPAKOTE ER) 250 MG 24 hr tablet   ELIQUIS 5 MG TABS tablet   enoxaparin (LOVENOX) 150 MG/ML injection    ibuprofen (ADVIL) 200 MG tablet   lisinopril (ZESTRIL) 20 MG tablet   metoprolol tartrate (LOPRESSOR) 25 MG tablet   Multiple Vitamin (MULTIVITAMIN PO)   NAPROXEN PO   Oral Electrolytes (EMERGEN-C ELECTRO MIX PO)   Oxymetazoline HCl (NASAL SPRAY NA)   sildenafil (VIAGRA) 100 MG tablet   VITAMIN D PO   No current facility-administered medications for this encounter.   Jodell Cipro Ward, PA-C [facility] [phone]

## 2022-07-08 NOTE — Anesthesia Preprocedure Evaluation (Addendum)
Anesthesia Evaluation  Patient identified by MRN, date of birth, ID band Patient awake    Reviewed: Allergy & Precautions, NPO status , Patient's Chart, lab work & pertinent test results, reviewed documented beta blocker date and time   Airway Mallampati: II  TM Distance: >3 FB Neck ROM: Full    Dental no notable dental hx. (+) Teeth Intact, Dental Advisory Given, Caps, Poor Dentition   Pulmonary Current Smoker and Patient abstained from smoking., PE   Pulmonary exam normal breath sounds clear to auscultation       Cardiovascular hypertension, Pt. on medications and Pt. on home beta blockers + angina with exertion + DVT  Normal cardiovascular exam+ dysrhythmias Atrial Fibrillation + Valvular Problems/Murmurs AS and AI  Rhythm:Regular Rate:Normal  Normal EF  EKG 04/15/22 NSR, borderline 1st deg AV Block  Hx/o PAF  Mild AS and AI  Hx/o PTE 2016   Neuro/Psych Anxiety Adult ADHDPeripheral neuropathy     GI/Hepatic Neg liver ROS, GERD  Medicated and Controlled,  Endo/Other  negative endocrine ROS  Renal/GU negative Renal ROS  negative genitourinary   Musculoskeletal  (+) Arthritis , Osteoarthritis,  Left knee OA   Abdominal   Peds  Hematology Eliquis therapy - last dose 9/1 am Lovenox therapy- last dose yesterday am   Anesthesia Other Findings   Reproductive/Obstetrics ED                           Anesthesia Physical Anesthesia Plan  ASA: 2  Anesthesia Plan: Spinal   Post-op Pain Management: Minimal or no pain anticipated and Regional block*   Induction: Intravenous  PONV Risk Score and Plan: 1 and Treatment may vary due to age or medical condition and Propofol infusion  Airway Management Planned: Natural Airway, Simple Face Mask and Nasal Cannula  Additional Equipment: None  Intra-op Plan:   Post-operative Plan:   Informed Consent: I have reviewed the patients History and  Physical, chart, labs and discussed the procedure including the risks, benefits and alternatives for the proposed anesthesia with the patient or authorized representative who has indicated his/her understanding and acceptance.     Dental advisory given  Plan Discussed with: Anesthesiologist and CRNA  Anesthesia Plan Comments: (See PAT note 07/05/2022)       Anesthesia Quick Evaluation

## 2022-07-11 NOTE — H&P (Signed)
TOTAL KNEE ADMISSION H&P  Patient is being admitted for left total knee arthroplasty.  Subjective:  Chief Complaint:left knee pain.  HPI: Anthony Quinn, 61 y.o. male, has a history of pain and functional disability in the left knee due to arthritis and has failed non-surgical conservative treatments for greater than 12 weeks to includeNSAID's and/or analgesics and activity modification.  Onset of symptoms was gradual, starting 2 years ago with rapidlly worsening course since that time. The patient noted no past surgery on the left knee(s).  Patient currently rates pain in the left knee(s) at 8 out of 10 with activity. Patient has worsening of pain with activity and weight bearing, pain that interferes with activities of daily living, and pain with passive range of motion.  Patient has evidence of joint space narrowing by imaging studies. There is no active infection.  Patient Active Problem List   Diagnosis Date Noted   Atrial fibrillation (HCC) 04/12/2022   Paroxysmal atrial fibrillation (HCC) 04/12/2022   HYPERLIPIDEMIA 09/27/2008   Pulmonary embolism (HCC) 09/27/2008   Past Medical History:  Diagnosis Date   Anginal pain (HCC)    Anxiety    Arthritis    Atrial fibrillation (HCC)    DVT (deep venous thrombosis) (HCC)    GERD (gastroesophageal reflux disease)    Heart murmur    Hypertension    Neuropathy    PE (pulmonary thromboembolism) (HCC)     Past Surgical History:  Procedure Laterality Date   CARPAL TUNNEL RELEASE Right    CHOLECYSTECTOMY     GANGLION CYST EXCISION Right    HERNIA REPAIR     right arm surgery     x3    No current facility-administered medications for this encounter.   Current Outpatient Medications  Medication Sig Dispense Refill Last Dose   ADDERALL XR 15 MG 24 hr capsule Take 15 mg by mouth every morning.      aspirin EC 81 MG tablet Take 81 mg by mouth daily.      DEPO-TESTOSTERONE 200 MG/ML injection Inject 200 mg into the muscle once a week.       ELIQUIS 5 MG TABS tablet Take 5 mg by mouth 2 (two) times daily.      ibuprofen (ADVIL) 200 MG tablet Take 600 mg by mouth daily as needed (pain).      lisinopril (ZESTRIL) 20 MG tablet Take 20 mg by mouth 2 (two) times daily.      metoprolol tartrate (LOPRESSOR) 25 MG tablet Take 1 tablet (25 mg total) by mouth 2 (two) times daily. 180 tablet 3    Multiple Vitamin (MULTIVITAMIN PO) Take 1 packet by mouth daily.      NAPROXEN PO Take 3 tablets by mouth daily as needed (pain).      Oral Electrolytes (EMERGEN-C ELECTRO MIX PO) Take 1 packet by mouth daily.      Oxymetazoline HCl (NASAL SPRAY NA) Place 1 spray into the nose as needed (congestion).      sildenafil (VIAGRA) 100 MG tablet Take 50 mg by mouth as needed for erectile dysfunction.      VITAMIN D PO Take 1 capsule by mouth daily.      divalproex (DEPAKOTE ER) 250 MG 24 hr tablet Take 250 mg by mouth daily. (Patient not taking: Reported on 07/05/2022)   Not Taking   enoxaparin (LOVENOX) 150 MG/ML injection Inject 1 mL (150 mg total) into the skin daily. (Patient not taking: Reported on 07/05/2022) 3 mL 0 Not Taking  Allergies  Allergen Reactions   Bee Venom Swelling   Penicillins Other (See Comments)    Childhood reaction. Unknown     Social History   Tobacco Use   Smoking status: Every Day    Packs/day: 0.25    Types: Cigarettes   Smokeless tobacco: Former  Substance Use Topics   Alcohol use: Yes    Comment: occas    No family history on file.   Review of Systems  Constitutional:  Negative for chills and fever.  Respiratory:  Negative for cough and shortness of breath.   Cardiovascular:  Negative for chest pain.  Gastrointestinal:  Negative for nausea and vomiting.  Musculoskeletal:  Positive for arthralgias.     Objective:  Physical Exam Well nourished and well developed. General: Alert and oriented x3, cooperative and pleasant, no acute distress. Head: normocephalic, atraumatic, neck supple. Eyes:  EOMI.  Musculoskeletal: Left knee exam: No palpable effusion, warmth erythema Continued tenderness medially more so than laterally Slight flexion contracture with flexion over 100 degrees with pain and tightness No significant lower extremity edema or erythema  Calves soft and nontender. Motor function intact in LE. Strength 5/5 LE bilaterally. Neuro: Distal pulses 2+. Sensation to light touch intact in LE.  Vital signs in last 24 hours:    Labs:   Estimated body mass index is 29.13 kg/m as calculated from the following:   Height as of 07/05/22: 5\' 10"  (1.778 m).   Weight as of 07/05/22: 92.1 kg.   Imaging Review Plain radiographs demonstrate severe degenerative joint disease of the left knee(s). The overall alignment isneutral. The bone quality appears to be adequate for age and reported activity level.      Assessment/Plan:  End stage arthritis, left knee   The patient history, physical examination, clinical judgment of the provider and imaging studies are consistent with end stage degenerative joint disease of the left knee(s) and total knee arthroplasty is deemed medically necessary. The treatment options including medical management, injection therapy arthroscopy and arthroplasty were discussed at length. The risks and benefits of total knee arthroplasty were presented and reviewed. The risks due to aseptic loosening, infection, stiffness, patella tracking problems, thromboembolic complications and other imponderables were discussed. The patient acknowledged the explanation, agreed to proceed with the plan and consent was signed. Patient is being admitted for inpatient treatment for surgery, pain control, PT, OT, prophylactic antibiotics, VTE prophylaxis, progressive ambulation and ADL's and discharge planning. The patient is planning to be discharged  home.  Therapy Plans: outpatient therapy at [facility] Disposition: Home with wife Planned DVT Prophylaxis: Eliquis 5 BID (hx of  PE) DME needed: walker PCP: 07/07/22, FNP Cardiologist: Dr. Dalbert Garnet, TXA: IV Allergies: PCN - childhood (tolerates keflex) Anesthesia Concerns: none BMI: 30.4 Last HgbA1c: Not diabetic  Other: - oxycodone, robaxin, tylenol - No NSAIDs - will bridge with lovenox - ** Wants to leave ASAP the next day **    Patient's anticipated LOS is less than 2 midnights, meeting these requirements: - Younger than 31 - Lives within 1 hour of care - Has a competent adult at home to recover with post-op recover - NO history of  - Chronic pain requiring opiods  - Diabetes  - Coronary Artery Disease  - Heart failure  - Heart attack  - Stroke  - DVT/VTE  - Cardiac arrhythmia  - Respiratory Failure/COPD  - Renal failure  - Anemia  - Advanced Liver disease  77, PA-C Orthopedic Surgery [facility] [place] [phone]

## 2022-07-11 NOTE — Telephone Encounter (Signed)
Patient called and we reviewed his lovenox instructions.

## 2022-07-16 ENCOUNTER — Encounter (HOSPITAL_COMMUNITY)
Admission: RE | Disposition: A | Discharge status: Home / Self Care | Payer: Self-pay | Source: Home / Self Care | Attending: Orthopedic Surgery

## 2022-07-16 ENCOUNTER — Other Ambulatory Visit: Payer: Self-pay

## 2022-07-16 ENCOUNTER — Ambulatory Visit (HOSPITAL_BASED_OUTPATIENT_CLINIC_OR_DEPARTMENT_OTHER): Payer: No Typology Code available for payment source | Admitting: Anesthesiology

## 2022-07-16 ENCOUNTER — Encounter (HOSPITAL_COMMUNITY): Payer: Self-pay | Admitting: Orthopedic Surgery

## 2022-07-16 ENCOUNTER — Ambulatory Visit (HOSPITAL_COMMUNITY): Payer: No Typology Code available for payment source | Admitting: Physician Assistant

## 2022-07-16 ENCOUNTER — Observation Stay (HOSPITAL_COMMUNITY)
Admission: RE | Admit: 2022-07-16 | Discharge: 2022-07-17 | Disposition: A | Discharge status: Home / Self Care | Payer: No Typology Code available for payment source | Attending: Orthopedic Surgery | Admitting: Orthopedic Surgery

## 2022-07-16 DIAGNOSIS — Z79899 Other long term (current) drug therapy: Secondary | ICD-10-CM | POA: Insufficient documentation

## 2022-07-16 DIAGNOSIS — F17210 Nicotine dependence, cigarettes, uncomplicated: Secondary | ICD-10-CM | POA: Diagnosis not present

## 2022-07-16 DIAGNOSIS — Z86718 Personal history of other venous thrombosis and embolism: Secondary | ICD-10-CM | POA: Diagnosis not present

## 2022-07-16 DIAGNOSIS — Z86711 Personal history of pulmonary embolism: Secondary | ICD-10-CM | POA: Diagnosis not present

## 2022-07-16 DIAGNOSIS — I480 Paroxysmal atrial fibrillation: Secondary | ICD-10-CM | POA: Insufficient documentation

## 2022-07-16 DIAGNOSIS — Z96652 Presence of left artificial knee joint: Secondary | ICD-10-CM

## 2022-07-16 DIAGNOSIS — M1712 Unilateral primary osteoarthritis, left knee: Principal | ICD-10-CM | POA: Insufficient documentation

## 2022-07-16 DIAGNOSIS — I10 Essential (primary) hypertension: Secondary | ICD-10-CM | POA: Diagnosis not present

## 2022-07-16 DIAGNOSIS — Z7901 Long term (current) use of anticoagulants: Secondary | ICD-10-CM | POA: Diagnosis not present

## 2022-07-16 DIAGNOSIS — Z7982 Long term (current) use of aspirin: Secondary | ICD-10-CM | POA: Insufficient documentation

## 2022-07-16 HISTORY — PX: TOTAL KNEE ARTHROPLASTY: SHX125

## 2022-07-16 SURGERY — ARTHROPLASTY, KNEE, TOTAL
Anesthesia: Spinal | Site: Knee | Laterality: Left

## 2022-07-16 MED ORDER — PHENYLEPHRINE HCL-NACL 20-0.9 MG/250ML-% IV SOLN
INTRAVENOUS | Status: AC
  Filled 2022-07-16: qty 250

## 2022-07-16 MED ORDER — HYDROMORPHONE HCL 1 MG/ML IJ SOLN: 0.5000 mg | INTRAMUSCULAR | Status: DC | PRN

## 2022-07-16 MED ORDER — CEFAZOLIN SODIUM-DEXTROSE 2-4 GM/100ML-% IV SOLN
2.0000 g | INTRAVENOUS | Status: AC
  Administered 2022-07-16: 2 g via INTRAVENOUS
  Filled 2022-07-16: qty 100

## 2022-07-16 MED ORDER — DOCUSATE SODIUM 100 MG PO CAPS
100.0000 mg | ORAL_CAPSULE | Freq: Two times a day (BID) | ORAL | Status: DC
  Administered 2022-07-16 – 2022-07-17 (×2): 100 mg via ORAL
  Filled 2022-07-16 (×2): qty 1

## 2022-07-16 MED ORDER — FERROUS SULFATE 325 (65 FE) MG PO TABS
325.0000 mg | ORAL_TABLET | Freq: Three times a day (TID) | ORAL | Status: DC
  Administered 2022-07-17 (×2): 325 mg via ORAL
  Filled 2022-07-16 (×2): qty 1

## 2022-07-16 MED ORDER — OXYCODONE HCL 5 MG PO TABS
5.0000 mg | ORAL_TABLET | ORAL | Status: DC | PRN
  Administered 2022-07-16 – 2022-07-17 (×3): 10 mg via ORAL
  Filled 2022-07-16 (×4): qty 2

## 2022-07-16 MED ORDER — HYDROMORPHONE HCL 1 MG/ML IJ SOLN: 0.2500 mg | INTRAMUSCULAR | Status: DC | PRN

## 2022-07-16 MED ORDER — PROPOFOL 10 MG/ML IV BOLUS
INTRAVENOUS | Status: DC | PRN
  Administered 2022-07-16: 10 mg via INTRAVENOUS
  Administered 2022-07-16: 20 mg via INTRAVENOUS

## 2022-07-16 MED ORDER — MIDAZOLAM HCL 2 MG/2ML IJ SOLN
INTRAMUSCULAR | Status: AC
  Filled 2022-07-16: qty 2

## 2022-07-16 MED ORDER — LACTATED RINGERS IV SOLN: INTRAVENOUS | Status: DC

## 2022-07-16 MED ORDER — FENTANYL CITRATE (PF) 100 MCG/2ML IJ SOLN
INTRAMUSCULAR | Status: AC
  Filled 2022-07-16: qty 2

## 2022-07-16 MED ORDER — ONDANSETRON HCL 4 MG/2ML IJ SOLN: 4.0000 mg | Freq: Four times a day (QID) | INTRAMUSCULAR | Status: DC | PRN

## 2022-07-16 MED ORDER — FENTANYL CITRATE PF 50 MCG/ML IJ SOSY
PREFILLED_SYRINGE | INTRAMUSCULAR | Status: AC
  Filled 2022-07-16: qty 2

## 2022-07-16 MED ORDER — LISINOPRIL 20 MG PO TABS
20.0000 mg | ORAL_TABLET | Freq: Two times a day (BID) | ORAL | Status: DC
  Administered 2022-07-17: 20 mg via ORAL
  Filled 2022-07-16: qty 1

## 2022-07-16 MED ORDER — ONDANSETRON HCL 4 MG PO TABS
4.0000 mg | ORAL_TABLET | Freq: Four times a day (QID) | ORAL | Status: DC | PRN
  Filled 2022-07-16: qty 1

## 2022-07-16 MED ORDER — OXYCODONE HCL 5 MG PO TABS: 5.0000 mg | ORAL_TABLET | Freq: Once | ORAL | Status: DC | PRN

## 2022-07-16 MED ORDER — SODIUM CHLORIDE (PF) 0.9 % IJ SOLN
INTRAMUSCULAR | Status: DC | PRN
  Administered 2022-07-16: 30 mL

## 2022-07-16 MED ORDER — MENTHOL 3 MG MT LOZG
1.0000 | LOZENGE | OROMUCOSAL | Status: DC | PRN
Start: 2022-07-16 — End: 2022-07-17

## 2022-07-16 MED ORDER — PHENOL 1.4 % MT LIQD: 1.0000 | OROMUCOSAL | Status: DC | PRN

## 2022-07-16 MED ORDER — OXYCODONE HCL 5 MG/5ML PO SOLN: 5.0000 mg | Freq: Once | ORAL | Status: DC | PRN

## 2022-07-16 MED ORDER — TRANEXAMIC ACID-NACL 1000-0.7 MG/100ML-% IV SOLN
1000.0000 mg | INTRAVENOUS | Status: AC
  Administered 2022-07-16: 1000 mg via INTRAVENOUS
  Filled 2022-07-16: qty 100

## 2022-07-16 MED ORDER — DEXAMETHASONE SODIUM PHOSPHATE 10 MG/ML IJ SOLN: 8.0000 mg | Freq: Once | INTRAMUSCULAR | Status: DC

## 2022-07-16 MED ORDER — PHENYLEPHRINE HCL-NACL 20-0.9 MG/250ML-% IV SOLN
INTRAVENOUS | Status: DC | PRN
  Administered 2022-07-16: 30 ug/min via INTRAVENOUS

## 2022-07-16 MED ORDER — PROPOFOL 500 MG/50ML IV EMUL
INTRAVENOUS | Status: DC | PRN
  Administered 2022-07-16: 75 ug/kg/min via INTRAVENOUS

## 2022-07-16 MED ORDER — ORAL CARE MOUTH RINSE: 15.0000 mL | Freq: Once | OROMUCOSAL | Status: AC

## 2022-07-16 MED ORDER — LIDOCAINE 2% (20 MG/ML) 5 ML SYRINGE
INTRAMUSCULAR | Status: DC | PRN
  Administered 2022-07-16: 40 mg via INTRAVENOUS

## 2022-07-16 MED ORDER — POLYETHYLENE GLYCOL 3350 17 G PO PACK: 17.0000 g | PACK | Freq: Every day | ORAL | Status: DC | PRN

## 2022-07-16 MED ORDER — ONDANSETRON HCL 4 MG/2ML IJ SOLN
4.0000 mg | Freq: Once | INTRAMUSCULAR | Status: DC | PRN
Start: 2022-07-16 — End: 2022-07-16

## 2022-07-16 MED ORDER — PROPOFOL 10 MG/ML IV BOLUS
INTRAVENOUS | Status: AC
Start: 2022-07-16 — End: ?
  Filled 2022-07-16: qty 20

## 2022-07-16 MED ORDER — KETOROLAC TROMETHAMINE 30 MG/ML IJ SOLN
INTRAMUSCULAR | Status: DC | PRN
  Administered 2022-07-16: 30 mg

## 2022-07-16 MED ORDER — DEXAMETHASONE SODIUM PHOSPHATE 10 MG/ML IJ SOLN
10.0000 mg | Freq: Once | INTRAMUSCULAR | Status: AC
  Administered 2022-07-17: 10 mg via INTRAVENOUS
  Filled 2022-07-16: qty 1

## 2022-07-16 MED ORDER — AMPHETAMINE-DEXTROAMPHET ER 15 MG PO CP24: 15.0000 mg | ORAL_CAPSULE | Freq: Every day | ORAL | Status: DC

## 2022-07-16 MED ORDER — METHOCARBAMOL 500 MG IVPB - SIMPLE MED: 500.0000 mg | Freq: Four times a day (QID) | INTRAVENOUS | Status: DC | PRN

## 2022-07-16 MED ORDER — KETOROLAC TROMETHAMINE 30 MG/ML IJ SOLN
INTRAMUSCULAR | Status: AC
  Filled 2022-07-16: qty 1

## 2022-07-16 MED ORDER — TRANEXAMIC ACID-NACL 1000-0.7 MG/100ML-% IV SOLN
1000.0000 mg | Freq: Once | INTRAVENOUS | Status: AC
  Administered 2022-07-16: 1000 mg via INTRAVENOUS
  Filled 2022-07-16: qty 100

## 2022-07-16 MED ORDER — SODIUM CHLORIDE 0.9 % IR SOLN
Status: DC | PRN
  Administered 2022-07-16: 1000 mL

## 2022-07-16 MED ORDER — MIDAZOLAM HCL 2 MG/2ML IJ SOLN
1.0000 mg | INTRAMUSCULAR | Status: DC
  Administered 2022-07-16: 2 mg via INTRAVENOUS
  Administered 2022-07-16: 1 mg via INTRAVENOUS

## 2022-07-16 MED ORDER — METOPROLOL TARTRATE 25 MG PO TABS
25.0000 mg | ORAL_TABLET | Freq: Two times a day (BID) | ORAL | Status: DC
  Administered 2022-07-16 – 2022-07-17 (×2): 25 mg via ORAL
  Filled 2022-07-16 (×2): qty 1

## 2022-07-16 MED ORDER — ACETAMINOPHEN 500 MG PO TABS
1000.0000 mg | ORAL_TABLET | Freq: Four times a day (QID) | ORAL | Status: DC
  Administered 2022-07-16 – 2022-07-17 (×3): 1000 mg via ORAL
  Filled 2022-07-16 (×3): qty 2

## 2022-07-16 MED ORDER — PHENYLEPHRINE 80 MCG/ML (10ML) SYRINGE FOR IV PUSH (FOR BLOOD PRESSURE SUPPORT)
PREFILLED_SYRINGE | INTRAVENOUS | Status: DC | PRN
  Administered 2022-07-16: 30 ug via INTRAVENOUS

## 2022-07-16 MED ORDER — POVIDONE-IODINE 10 % EX SWAB
2.0000 | Freq: Once | CUTANEOUS | Status: AC
  Administered 2022-07-16: 2 via TOPICAL

## 2022-07-16 MED ORDER — 0.9 % SODIUM CHLORIDE (POUR BTL) OPTIME
TOPICAL | Status: DC | PRN
  Administered 2022-07-16: 1000 mL

## 2022-07-16 MED ORDER — APIXABAN 5 MG PO TABS
5.0000 mg | ORAL_TABLET | Freq: Two times a day (BID) | ORAL | Status: DC
  Administered 2022-07-17: 5 mg via ORAL
  Filled 2022-07-16: qty 1

## 2022-07-16 MED ORDER — ONDANSETRON HCL 4 MG/2ML IJ SOLN
INTRAMUSCULAR | Status: AC
  Filled 2022-07-16: qty 2

## 2022-07-16 MED ORDER — DIPHENHYDRAMINE HCL 12.5 MG/5ML PO ELIX: 12.5000 mg | ORAL_SOLUTION | ORAL | Status: DC | PRN

## 2022-07-16 MED ORDER — BUPIVACAINE-EPINEPHRINE (PF) 0.25% -1:200000 IJ SOLN
INTRAMUSCULAR | Status: AC
Start: 2022-07-16 — End: ?
  Filled 2022-07-16: qty 30

## 2022-07-16 MED ORDER — ROPIVACAINE HCL 7.5 MG/ML IJ SOLN
INTRAMUSCULAR | Status: DC | PRN
  Administered 2022-07-16: 20 mL via PERINEURAL

## 2022-07-16 MED ORDER — DEXAMETHASONE SODIUM PHOSPHATE 10 MG/ML IJ SOLN
INTRAMUSCULAR | Status: AC
  Filled 2022-07-16: qty 1

## 2022-07-16 MED ORDER — METOCLOPRAMIDE HCL 5 MG PO TABS
5.0000 mg | ORAL_TABLET | Freq: Three times a day (TID) | ORAL | Status: DC | PRN
  Filled 2022-07-16: qty 2

## 2022-07-16 MED ORDER — PHENYLEPHRINE HCL (PRESSORS) 10 MG/ML IV SOLN
INTRAVENOUS | Status: AC
  Filled 2022-07-16: qty 1

## 2022-07-16 MED ORDER — OXYCODONE HCL 5 MG PO TABS
10.0000 mg | ORAL_TABLET | ORAL | Status: DC | PRN
  Administered 2022-07-17: 10 mg via ORAL

## 2022-07-16 MED ORDER — METOCLOPRAMIDE HCL 5 MG/ML IJ SOLN: 5.0000 mg | Freq: Three times a day (TID) | INTRAMUSCULAR | Status: DC | PRN

## 2022-07-16 MED ORDER — METHOCARBAMOL 500 MG PO TABS
500.0000 mg | ORAL_TABLET | Freq: Four times a day (QID) | ORAL | Status: DC | PRN
  Filled 2022-07-16 (×2): qty 1

## 2022-07-16 MED ORDER — CHLORHEXIDINE GLUCONATE 0.12 % MT SOLN
15.0000 mL | Freq: Once | OROMUCOSAL | Status: AC
  Administered 2022-07-16: 15 mL via OROMUCOSAL

## 2022-07-16 MED ORDER — FENTANYL CITRATE PF 50 MCG/ML IJ SOSY
50.0000 ug | PREFILLED_SYRINGE | INTRAMUSCULAR | Status: DC
  Administered 2022-07-16: 100 ug via INTRAVENOUS

## 2022-07-16 MED ORDER — DEXAMETHASONE SODIUM PHOSPHATE 10 MG/ML IJ SOLN
INTRAMUSCULAR | Status: DC | PRN
  Administered 2022-07-16: 10 mg via INTRAVENOUS

## 2022-07-16 MED ORDER — AMPHETAMINE-DEXTROAMPHET ER 5 MG PO CP24: 15.0000 mg | ORAL_CAPSULE | Freq: Every day | ORAL | Status: DC

## 2022-07-16 MED ORDER — SODIUM CHLORIDE 0.9 % IV SOLN: INTRAVENOUS | Status: DC

## 2022-07-16 MED ORDER — CEFAZOLIN SODIUM-DEXTROSE 2-4 GM/100ML-% IV SOLN
2.0000 g | Freq: Four times a day (QID) | INTRAVENOUS | Status: AC
  Administered 2022-07-16 – 2022-07-17 (×2): 2 g via INTRAVENOUS
  Filled 2022-07-16 (×2): qty 100

## 2022-07-16 MED ORDER — BUPIVACAINE-EPINEPHRINE (PF) 0.25% -1:200000 IJ SOLN
INTRAMUSCULAR | Status: DC | PRN
  Administered 2022-07-16: 30 mL

## 2022-07-16 MED ORDER — BISACODYL 10 MG RE SUPP: 10.0000 mg | Freq: Every day | RECTAL | Status: DC | PRN

## 2022-07-16 MED ORDER — SODIUM CHLORIDE (PF) 0.9 % IJ SOLN
INTRAMUSCULAR | Status: AC
Start: 2022-07-16 — End: ?
  Filled 2022-07-16: qty 30

## 2022-07-16 SURGICAL SUPPLY — 51 items
ATTUNE MED ANAT PAT 38 KNEE (Knees) IMPLANT
ATTUNE PSFEM LTSZ6 NARCEM KNEE (Femur) IMPLANT
ATTUNE PSRP INSR SZ6 6 KNEE (Insert) IMPLANT
BAG COUNTER SPONGE SURGICOUNT (BAG) IMPLANT
BAG ZIPLOCK 12X15 (MISCELLANEOUS) IMPLANT
BASE TIBIAL ROT PLAT SZ 7 KNEE (Knees) IMPLANT
BLADE SAW SGTL 11.0X1.19X90.0M (BLADE) IMPLANT
BLADE SAW SGTL 13.0X1.19X90.0M (BLADE) ×1 IMPLANT
BNDG ELASTIC 6X5.8 VLCR STR LF (GAUZE/BANDAGES/DRESSINGS) ×1 IMPLANT
BOWL SMART MIX CTS (DISPOSABLE) ×1 IMPLANT
CEMENT HV SMART SET (Cement) IMPLANT
CUFF TOURN SGL QUICK 34 (TOURNIQUET CUFF) ×1
CUFF TRNQT CYL 34X4.125X (TOURNIQUET CUFF) ×1 IMPLANT
DERMABOND ADVANCED (GAUZE/BANDAGES/DRESSINGS) ×1
DERMABOND ADVANCED .7 DNX12 (GAUZE/BANDAGES/DRESSINGS) ×1 IMPLANT
DRAPE U-SHAPE 47X51 STRL (DRAPES) ×1 IMPLANT
DRESSING AQUACEL AG SP 3.5X10 (GAUZE/BANDAGES/DRESSINGS) ×1 IMPLANT
DRSG AQUACEL AG ADV 3.5X10 (GAUZE/BANDAGES/DRESSINGS) IMPLANT
DRSG AQUACEL AG SP 3.5X10 (GAUZE/BANDAGES/DRESSINGS) ×1
DURAPREP 26ML APPLICATOR (WOUND CARE) ×2 IMPLANT
ELECT REM PT RETURN 15FT ADLT (MISCELLANEOUS) ×1 IMPLANT
GLOVE BIO SURGEON STRL SZ 6 (GLOVE) ×1 IMPLANT
GLOVE BIOGEL PI IND STRL 6.5 (GLOVE) ×1 IMPLANT
GLOVE BIOGEL PI IND STRL 7.5 (GLOVE) ×1 IMPLANT
GLOVE ORTHO TXT STRL SZ7.5 (GLOVE) ×2 IMPLANT
GOWN STRL REUS W/ TWL LRG LVL3 (GOWN DISPOSABLE) ×2 IMPLANT
GOWN STRL REUS W/TWL LRG LVL3 (GOWN DISPOSABLE) ×2
HANDPIECE INTERPULSE COAX TIP (DISPOSABLE) ×1
HOLDER FOLEY CATH W/STRAP (MISCELLANEOUS) IMPLANT
KIT TURNOVER KIT A (KITS) IMPLANT
MANIFOLD NEPTUNE II (INSTRUMENTS) ×1 IMPLANT
NDL SAFETY ECLIP 18X1.5 (MISCELLANEOUS) IMPLANT
NS IRRIG 1000ML POUR BTL (IV SOLUTION) ×1 IMPLANT
PACK TOTAL KNEE CUSTOM (KITS) ×1 IMPLANT
PIN FIX SIGMA LCS THRD HI (PIN) IMPLANT
PROTECTOR NERVE ULNAR (MISCELLANEOUS) ×1 IMPLANT
SET HNDPC FAN SPRY TIP SCT (DISPOSABLE) ×1 IMPLANT
SET PAD KNEE POSITIONER (MISCELLANEOUS) ×1 IMPLANT
SPIKE FLUID TRANSFER (MISCELLANEOUS) ×2 IMPLANT
SUT MNCRL AB 4-0 PS2 18 (SUTURE) ×1 IMPLANT
SUT STRATAFIX PDS+ 0 24IN (SUTURE) ×1 IMPLANT
SUT VIC AB 1 CT1 36 (SUTURE) ×1 IMPLANT
SUT VIC AB 2-0 CT1 27 (SUTURE) ×2
SUT VIC AB 2-0 CT1 TAPERPNT 27 (SUTURE) ×2 IMPLANT
SYR 3ML LL SCALE MARK (SYRINGE) ×1 IMPLANT
TIBIAL BASE ROT PLAT SZ 7 KNEE (Knees) ×1 IMPLANT
TOWEL GREEN STERILE FF (TOWEL DISPOSABLE) ×1 IMPLANT
TRAY FOLEY MTR SLVR 16FR STAT (SET/KITS/TRAYS/PACK) ×1 IMPLANT
TUBE SUCTION HIGH CAP CLEAR NV (SUCTIONS) ×1 IMPLANT
WATER STERILE IRR 1000ML POUR (IV SOLUTION) ×2 IMPLANT
WRAP KNEE MAXI GEL POST OP (GAUZE/BANDAGES/DRESSINGS) ×1 IMPLANT

## 2022-07-16 NOTE — Anesthesia Procedure Notes (Signed)
Spinal  Patient location during procedure: OR Start time: 07/16/2022 4:16 PM End time: 07/16/2022 4:19 PM Reason for block: surgical anesthesia Staffing Performed: anesthesiologist  Anesthesiologist: Mal Amabile, MD Performed by: Mal Amabile, MD Authorized by: Mal Amabile, MD   Preanesthetic Checklist Completed: patient identified, IV checked, site marked, risks and benefits discussed, surgical consent, monitors and equipment checked, pre-op evaluation and timeout performed Spinal Block Patient position: sitting Prep: DuraPrep and site prepped and draped Patient monitoring: heart rate, cardiac monitor, continuous pulse ox and blood pressure Approach: midline Location: L3-4 Injection technique: single-shot Needle Needle type: Pencan  Needle gauge: 24 G Needle length: 9 cm Needle insertion depth: 7 cm Assessment Sensory level: T6 Events: CSF return Additional Notes Patient tolerated procedure well. Adequate sensory level.

## 2022-07-16 NOTE — Progress Notes (Deleted)
Cardiology Office Note:    Date:  07/16/2022   ID:  Anthony Quinn, DOB 11-Nov-1961, MRN [id number]  PCP:  Allayne Butcher, NP  [place] Prov[facility] Cardiologist:  Mertie Moores, MD { Click to update primary MD,subspecialty MD or APP then REFRESH:1}  *** Referring MD: Allayne Butcher, NP   Chief Complaint:  No chief complaint on file. {Click here for Visit Info    :1}   Patient Profile: Paroxysmal atrial fibrillation Hx of pulmonary embolism x 2  Long term anticoagulation GERD  Prior CV Studies: ECHO COMPLETE WO IMAGING ENHANCING AGENT 05/13/2022  Narrative ECHOCARDIOGRAM REPORT    Patient Name:   Anthony Quinn Date of Exam: 05/13/2022 Medical Rec #:  [id number]     Height:       70.0 in Accession #:    [id number]    Weight:       208.6 lb Date of Birth:  05/08/1961     BSA:          2.125 m Patient Age:    88 years      BP:           150/78 mmHg Patient Gender: M             HR:           70 bpm. Exam Location:  [facility]  Procedure: 2D Echo, Color Doppler, Cardiac Doppler and Strain Analysis  Indications:    I35.0 Nonrheumatic aortic (valve) stenosis; I48.1 Persistent atrial fibrillation  History:        Patient has no prior history of Echocardiogram examinations. Arrythmias:Atrial Fibrillation; Risk Factors:Current Smoker, Hypertension and Dyslipidemia.  Sonographer:    Estill Bamberg McFatter RDMS, RVT, RDCS Referring Phys: [place]   1. Left ventricular ejection fraction, by estimation, is 65 to 70%. The left ventricle has normal function. The left ventricle has no regional wall motion abnormalities. There is mild left ventricular hypertrophy. Left ventricular diastolic parameters are consistent with Grade I diastolic dysfunction (impaired relaxation). The average left ventricular global longitudinal strain is -24.6 %. The global longitudinal strain is normal. 2. Right ventricular systolic function is normal. The right ventricular size is normal.  There is normal pulmonary artery systolic pressure. The estimated right ventricular systolic pressure is 0000000 mmHg. 3. The mitral valve is grossly normal. Mild mitral valve regurgitation. 4. The aortic valve has an indeterminant number of cusps, cannot completely exclude bicupid morphology. There is moderate calcification of the aortic valve. Aortic valve regurgitation is mild. Moderately sclerotic to mildly stenotic aortic valve. Aortic regurgitation PHT measures 681 msec. Aortic valve mean gradient measures 13.2 mmHg. Aortic valve Vmax measures 2.50 m/s. Dimentionless index 0.67. 5. The inferior vena cava is normal in size with greater than 50% respiratory variability, suggesting right atrial pressure of 3 mmHg.  Comparison(s): No prior Echocardiogram.  FINDINGS Left Ventricle: Left ventricular ejection fraction, by estimation, is 65 to 70%. The left ventricle has normal function. The left ventricle has no regional wall motion abnormalities. The average left ventricular global longitudinal strain is -24.6 %. The global longitudinal strain is normal. The left ventricular internal cavity size was normal in size. There is mild left ventricular hypertrophy. Left ventricular diastolic parameters are consistent with Grade I diastolic dysfunction (impaired relaxation).  Right Ventricle: The right ventricular size is normal. No increase in right ventricular wall thickness. Right ventricular systolic function is normal. There is normal pulmonary artery systolic pressure. The tricuspid regurgitant velocity is 2.67 m/s, and with  an assumed right atrial pressure of 3 mmHg, the estimated right ventricular systolic pressure is 31.5 mmHg.  Left Atrium: Left atrial size was normal in size.  Right Atrium: Right atrial size was normal in size.  Pericardium: There is no evidence of pericardial effusion.  Mitral Valve: The mitral valve is grossly normal. Mild mitral annular calcification. Mild mitral valve  regurgitation.  Tricuspid Valve: The tricuspid valve is grossly normal. Tricuspid valve regurgitation is trivial.  Aortic Valve: The aortic valve has an indeterminant number of cusps. There is moderate calcification of the aortic valve. Aortic valve regurgitation is mild. Aortic regurgitation PHT measures 681 msec. Mild aortic stenosis is present. Aortic valve mean gradient measures 13.2 mmHg. Aortic valve peak gradient measures 24.9 mmHg. Aortic valve area, by VTI measures 2.31 cm.  Pulmonic Valve: The pulmonic valve was grossly normal. Pulmonic valve regurgitation is trivial.  Aorta: The aortic root is normal in size and structure.  Venous: The inferior vena cava is normal in size with greater than 50% respiratory variability, suggesting right atrial pressure of 3 mmHg.  IAS/Shunts: No atrial level shunt detected by color flow Doppler.   LEFT VENTRICLE PLAX 2D LVIDd:         4.70 cm      Diastology LVIDs:         2.02 cm      LV e' medial:    7.07 cm/s LV PW:         1.23 cm      LV E/e' medial:  15.3 LV IVS:        1.31 cm      LV e' lateral:   13.70 cm/s LVOT diam:     2.10 cm      LV E/e' lateral: 7.9 LV SV:         123 LV SV Index:   58           2D Longitudinal Strain LVOT Area:     3.46 cm     2D Strain GLS (A2C):   -27.0 % 2D Strain GLS (A3C):   -22.4 % 2D Strain GLS (A4C):   -24.4 % LV Volumes (MOD)            2D Strain GLS Avg:     -24.6 % LV vol d, MOD A2C: 97.7 ml LV vol d, MOD A4C: 126.0 ml LV vol s, MOD A2C: 20.1 ml LV vol s, MOD A4C: 47.3 ml LV SV MOD A2C:     77.6 ml LV SV MOD A4C:     126.0 ml LV SV MOD BP:      81.2 ml  RIGHT VENTRICLE RV S prime:     18.50 cm/s TAPSE (M-mode): 3.3 cm  LEFT ATRIUM             Index        RIGHT ATRIUM           Index LA diam:        3.10 cm 1.46 cm/m   RA Area:     19.60 cm LA Vol (A2C):   48.2 ml 22.68 ml/m  RA Volume:   59.10 ml  27.81 ml/m LA Vol (A4C):   44.7 ml 21.04 ml/m LA Biplane Vol: 50.9 ml 23.95  ml/m AORTIC VALVE AV Area (Vmax):    2.25 cm AV Area (Vmean):   2.38 cm AV Area (VTI):     2.31 cm AV Vmax:  249.60 cm/s AV Vmean:          169.000 cm/s AV VTI:            0.532 m AV Peak Grad:      24.9 mmHg AV Mean Grad:      13.2 mmHg LVOT Vmax:         162.00 cm/s LVOT Vmean:        116.000 cm/s LVOT VTI:          0.355 m LVOT/AV VTI ratio: 0.67 AI PHT:            681 msec  AORTA Ao Root diam: 3.60 cm Ao Asc diam:  3.10 cm  MITRAL VALVE                TRICUSPID VALVE MV Area (PHT): 2.74 cm     TR Peak grad:   28.5 mmHg MV Decel Time: 277 msec     TR Vmax:        267.00 cm/s MR Peak grad: 89.2 mmHg MR Vmax:      472.33 cm/s   SHUNTS MV E velocity: 108.00 cm/s  Systemic VTI:  0.36 m MV A velocity: 92.90 cm/s   Systemic Diam: 2.10 cm MV E/A ratio:  1.16  Nona Dell MD Electronically signed by Nona Dell MD Signature Date/Time: 05/13/2022/5:24:21 PM    Final    ***  History of Present Illness:   Anthony Quinn is a 61 y.o. male with the above problem list.  He ***        Past Medical History:  Diagnosis Date   Anginal pain (HCC)    Anxiety    Arthritis    Atrial fibrillation (HCC)    DVT (deep venous thrombosis) (HCC)    GERD (gastroesophageal reflux disease)    Heart murmur    Hypertension    Neuropathy    PE (pulmonary thromboembolism) (HCC)    Current Medications: No outpatient medications have been marked as taking for the 07/17/22 encounter (Appointment) with Tereso Newcomer T, PA-C.    Allergies:   Bee venom and Penicillins   Social History   Tobacco Use   Smoking status: Every Day    Packs/day: 0.25    Types: Cigarettes   Smokeless tobacco: Former  Building services engineer Use: Never used  Substance Use Topics   Alcohol use: Yes    Comment: occas   Drug use: Never    Family Hx: The patient's family history is not on file.  ROS   EKGs/Labs/Other Test Reviewed:    EKG:  EKG is *** ordered today.  The ekg ordered today  demonstrates ***  Recent Labs: 04/10/2022: Magnesium 1.8; TSH 0.561 07/05/2022: BUN 19; Creatinine, Ser 1.07; Hemoglobin 15.6; Platelets 159; Potassium 4.1; Sodium 139   Recent Lipid Panel No results for input(s): "CHOL", "TRIG", "HDL", "VLDL", "LDLCALC", "LDLDIRECT" in the last 8760 hours.   Risk Assessment/Calculations/Metrics:   {Does this patient have ATRIAL FIBRILLATION?[phone]}          Physical Exam:    VS:  There were no vitals taken for this visit.    Wt Readings from Last 3 Encounters:  07/05/22 203 lb (92.1 kg)  04/12/22 208 lb 9.6 oz (94.6 kg)  04/10/22 210 lb (95.3 kg)    Physical Exam ***     ASSESSMENT & PLAN:   No problem-specific Assessment & Plan notes found for this encounter.        {Are you ordering a  CV Procedure (e.g. stress test, cath, DCCV, TEE, etc)?   Press F2        :[id number]   Dispo:  No follow-ups on file.   Medication Adjustments/Labs and Tests Ordered: Current medicines are reviewed at length with the patient today.  Concerns regarding medicines are outlined above.  Tests Ordered: No orders of the defined types were placed in this encounter.  Medication Changes: No orders of the defined types were placed in this encounter.  Signed, Richardson Dopp, PA-C  07/16/2022 5:51 PM    [facility] [address], [place], [place]  [address] Phone: [phone]; Fax: [phone]

## 2022-07-16 NOTE — Discharge Instructions (Signed)

## 2022-07-16 NOTE — Anesthesia Procedure Notes (Signed)
Date/Time: 07/16/2022 4:12 PM  Performed by: Minerva Ends, CRNAPre-anesthesia Checklist: Patient identified, Emergency Drugs available, Suction available, Patient being monitored and Timeout performed Patient Re-evaluated:Patient Re-evaluated prior to induction Oxygen Delivery Method: Simple face mask Placement Confirmation: positive ETCO2 and breath sounds checked- equal and bilateral Dental Injury: Teeth and Oropharynx as per pre-operative assessment

## 2022-07-16 NOTE — Anesthesia Postprocedure Evaluation (Signed)
Anesthesia Post Note  Patient: Anthony Quinn  Procedure(s) Performed: TOTAL KNEE ARTHROPLASTY (Left: Knee)     Patient location during evaluation: PACU Anesthesia Type: Spinal Level of consciousness: oriented, awake and alert and awake Pain management: pain level controlled Vital Signs Assessment: post-procedure vital signs reviewed and stable Respiratory status: spontaneous breathing, respiratory function stable and nonlabored ventilation Cardiovascular status: blood pressure returned to baseline and stable Postop Assessment: no headache, no backache, no apparent nausea or vomiting, patient able to bend at knees and spinal receding Anesthetic complications: no   No notable events documented.               [name] A.

## 2022-07-16 NOTE — Op Note (Signed)
NAME:  Anthony Quinn                      MEDICAL RECORD NO.:  [id number]                             FACILITY:  [facility]      PHYSICIAN:  Madlyn Frankel. Charlann Boxer, M.D.  DATE OF BIRTH:  1961/08/30      DATE OF PROCEDURE:  07/16/2022                                     OPERATIVE REPORT         PREOPERATIVE DIAGNOSIS:  Left knee osteoarthritis.      POSTOPERATIVE DIAGNOSIS:  Left knee osteoarthritis.      FINDINGS:  The patient was noted to have complete loss of cartilage and   bone-on-bone arthritis with associated osteophytes in the medial and patellofemoral compartments of   the knee with a significant synovitis and associated effusion.  The patient had failed months of conservative treatment including medications, injection therapy, activity modification.     PROCEDURE:  Left total knee replacement.      COMPONENTS USED:  DePuy Attune rotating platform posterior stabilized knee   system, a size 6N femur, 7 tibia, size 6 mm PS AOX insert, and 38 anatomic patellar   button.      SURGEON:  Madlyn Frankel. Charlann Boxer, M.D.      ASSISTANT:  Rosalene Billings, PA-C.      ANESTHESIA:  Regional and Spinal.      SPECIMENS:  None.      COMPLICATION:  None.      DRAINS:  None.  EBL: <200 cc      TOURNIQUET TIME:  36 min at     The patient was stable to the recovery room.      INDICATION FOR PROCEDURE:  Anthony Quinn is a 61 y.o. male patient of   mine.  The patient had been seen, evaluated, and treated for months conservatively in the   office with medication, activity modification, and injections.  The patient had   radiographic changes of bone-on-bone arthritis with endplate sclerosis and osteophytes noted.  Based on the radiographic changes and failed conservative measures, the patient   decided to proceed with definitive treatment, total knee replacement.  Risks of infection, DVT, component failure, need for revision surgery, neurovascular injury were reviewed in the office setting.  The  postop course was reviewed stressing the efforts to maximize post-operative satisfaction and function.  Consent was obtained for benefit of pain   relief.      PROCEDURE IN DETAIL:  The patient was brought to the operative theater.   Once adequate anesthesia, preoperative antibiotics, 2 gm of Ancef,1 gm of Tranexamic Acid, and 10 mg of Decadron administered, the patient was positioned supine with a left thigh tourniquet placed.  The  left lower extremity was prepped and draped in sterile fashion.  A time-   out was performed identifying the patient, planned procedure, and the appropriate extremity.      The left lower extremity was placed in the [facility] leg holder.  The leg was   exsanguinated, tourniquet elevated to 250 mmHg.  A midline incision was   made followed by median parapatellar arthrotomy.  Following initial   exposure, attention was first directed to  the patella.  Precut   measurement was noted to be 24 mm.  I resected down to 14 mm and used a   38 anatomic patellar button to restore patellar height as well as cover the cut surface.      The lug holes were drilled and a metal shim was placed to protect the   patella from retractors and saw blade during the procedure.      At this point, attention was now directed to the femur.  The femoral   canal was opened with a drill, irrigated to try to prevent fat emboli.  An   intramedullary rod was passed at 5 degrees valgus, 9 mm of bone was   resected off the distal femur.  Following this resection, the tibia was   subluxated anteriorly.  Using the extramedullary guide, 2 mm of bone was resected off   the proximal medial tibia.  We confirmed the gap would be   stable medially and laterally with a size 5 spacer block as well as confirmed that the tibial cut was perpendicular in the coronal plane, checking with an alignment rod.      Once this was done, I sized the femur to be a size 6 in the anterior-   posterior dimension, chose a  narrow component based on medial and   lateral dimension.  The size 6 rotation block was then pinned in   position anterior referenced using the C-clamp to set rotation.  The   anterior, posterior, and  chamfer cuts were made without difficulty nor   notching making certain that I was along the anterior cortex to help   with flexion gap stability.      The final box cut was made off the lateral aspect of distal femur.      At this point, the tibia was sized to be a size 7.  The size 7 tray was   then pinned in position through the medial third of the tubercle,   drilled, and keel punched.  Trial reduction was now carried with a 6 femur,  7 tibia, a size 6 mm PS insert, and the 38 anatomic patella botton.  The knee was brought to full extension with good flexion stability with the patella   tracking through the trochlea without application of pressure.  Given   all these findings the trial components removed.  Final components were   opened and cement was mixed.  The knee was irrigated with normal saline solution and pulse lavage.  The synovial lining was   then injected with 30 cc of 0.25% Marcaine with epinephrine, 1 cc of Toradol and 30 cc of NS for a total of 61 cc.     Final implants were then cemented onto cleaned and dried cut surfaces of bone with the knee brought to extension with a size 6 mm PS trial insert.      Once the cement had fully cured, excess cement was removed   throughout the knee.  I confirmed that I was satisfied with the range of   motion and stability, and the final size 6 mm PS AOX insert was chosen.  It was   placed into the knee.      The tourniquet had been let down at 36 minutes.  No significant   hemostasis was required.  The extensor mechanism was then reapproximated using #1 Vicryl and #1 Stratafix sutures with the knee   in flexion.  The   remaining wound  was closed with 2-0 Vicryl and running 4-0 Monocryl.   The knee was cleaned, dried, dressed  sterilely using Dermabond and   Aquacel dressing.  The patient was then   brought to recovery room in stable condition, tolerating the procedure   well.   Please note that Physician Assistant, Rosalene Billings, PA-C was present for the entirety of the case, and was utilized for pre-operative positioning, peri-operative retractor management, general facilitation of the procedure and for primary wound closure at the end of the case.              Madlyn Frankel Charlann Boxer, M.D.    07/16/2022 3:51 PM

## 2022-07-16 NOTE — Transfer of Care (Signed)
Immediate Anesthesia Transfer of Care Note  Patient: Anthony Quinn  Procedure(s) Performed: Procedure(s): TOTAL KNEE ARTHROPLASTY (Left)  Patient Location: PACU  Anesthesia Type:Spinal  Level of Consciousness: awake, alert  and oriented  Airway & Oxygen Therapy: Patient Spontanous Breathing  Post-op Assessment: Report given to RN and Post -op Vital signs reviewed and stable  Post vital signs: Reviewed and stable  Last Vitals:  Vitals:   07/16/22 1540 07/16/22 1550  BP: (!) 122/56 (!) 126/58  Pulse: (!) 55 (!) 51  Resp: 17 14  Temp:    SpO2: 95% 96%    Complications: No apparent anesthesia complications

## 2022-07-16 NOTE — Anesthesia Procedure Notes (Addendum)
Anesthesia Regional Block: Adductor canal block   Pre-Anesthetic Checklist: , timeout performed,  Correct Patient, Correct Site, Correct Laterality,  Correct Procedure, Correct Position, site marked,  Risks and benefits discussed,  Surgical consent,  Pre-op evaluation,  At surgeon's request and post-op pain management  Laterality: Left  Prep: chloraprep       Needles:  Injection technique: Single-shot  Needle Type: Echogenic Stimulator Needle     Needle Length: 10cm  Needle Gauge: 21   Needle insertion depth: 7 cm   Additional Needles:   Procedures:,,,, ultrasound used (permanent image in chart),,    Narrative:  Start time: 07/16/2022 3:25 PM End time: 07/16/2022 3:30 PM Injection made incrementally with aspirations every 5 mL.  Performed by: Personally  Anesthesiologist: Mal Amabile, MD  Additional Notes: Timeout performed. Patient sedated. Relevant anatomy ID'd using [place]. Incremental 2-57ml injection of LA with frequent aspiration. Patient tolerated procedure well.     Left Adductor Canal Block

## 2022-07-17 ENCOUNTER — Ambulatory Visit: Payer: BC Managed Care – PPO | Admitting: Physician Assistant

## 2022-07-17 ENCOUNTER — Encounter (HOSPITAL_COMMUNITY): Payer: Self-pay | Admitting: Orthopedic Surgery

## 2022-07-17 DIAGNOSIS — M1712 Unilateral primary osteoarthritis, left knee: Secondary | ICD-10-CM | POA: Diagnosis not present

## 2022-07-17 LAB — BASIC METABOLIC PANEL
Anion gap: 8 (ref 5–15)
BUN: 24 mg/dL — ABNORMAL HIGH (ref 6–20)
CO2: 27 mmol/L (ref 22–32)
Calcium: 8.7 mg/dL — ABNORMAL LOW (ref 8.9–10.3)
Chloride: 103 mmol/L (ref 98–111)
Creatinine, Ser: 1.13 mg/dL (ref 0.61–1.24)
GFR, Estimated: >60 mL/min (ref >60)
Glucose, Bld: 164 mg/dL — ABNORMAL HIGH (ref 70–99)
Potassium: 4.3 mmol/L (ref 3.5–5.1)
Sodium: 138 mmol/L (ref 135–145)

## 2022-07-17 LAB — CBC
HCT: 39.6 % (ref 39.0–52.0)
Hemoglobin: 13.3 g/dL (ref 13.0–17.0)
MCH: 31 pg (ref 26.0–34.0)
MCHC: 33.6 g/dL (ref 30.0–36.0)
MCV: 92.3 fL (ref 80.0–100.0)
Platelets: 124 10*3/uL — ABNORMAL LOW (ref 150–400)
RBC: 4.29 MIL/uL (ref 4.22–5.81)
RDW: 15 % (ref 11.5–15.5)
WBC: 14.7 10*3/uL — ABNORMAL HIGH (ref 4.0–10.5)
nRBC: 0 % (ref 0.0–0.2)

## 2022-07-17 MED ORDER — METHOCARBAMOL 500 MG PO TABS: 500.0000 mg | ORAL_TABLET | Freq: Four times a day (QID) | ORAL | 1 refills | Status: AC | PRN

## 2022-07-17 MED ORDER — OXYCODONE HCL 5 MG PO TABS: 5.0000 mg | ORAL_TABLET | ORAL | 0 refills | Status: AC | PRN

## 2022-07-17 MED ORDER — ACETAMINOPHEN 500 MG PO TABS: 1000.0000 mg | ORAL_TABLET | Freq: Four times a day (QID) | ORAL | 0 refills | Status: AC

## 2022-07-17 MED ORDER — AMPHETAMINE-DEXTROAMPHET ER 5 MG PO CP24
15.0000 mg | ORAL_CAPSULE | Freq: Every day | ORAL | Status: DC
  Filled 2022-07-17: qty 3

## 2022-07-17 MED ORDER — DOCUSATE SODIUM 100 MG PO CAPS: 100.0000 mg | ORAL_CAPSULE | Freq: Two times a day (BID) | ORAL | 0 refills | Status: AC

## 2022-07-17 MED ORDER — POLYETHYLENE GLYCOL 3350 17 G PO PACK: 17.0000 g | PACK | Freq: Every day | ORAL | 0 refills | Status: AC | PRN

## 2022-07-17 NOTE — Plan of Care (Signed)
  Problem: Education: Goal: Knowledge of General Education information will improve Description: Including pain rating scale, medication(s)/side effects and non-pharmacologic comfort measures Outcome: Progressing   Problem: Safety: Goal: Ability to remain free from injury will improve Outcome: Progressing   Problem: Education: Goal: Knowledge of the prescribed therapeutic regimen will improve Outcome: Progressing   Problem: Activity: Goal: Range of joint motion will improve Outcome: Progressing

## 2022-07-17 NOTE — TOC Transition Note (Signed)
Transition of Care [facility]) - CM/SW Discharge Note  Patient Details  Name: Anthony Quinn MRN: [id number] Date of Birth: 1961-11-04  [facility]) CM/SW Contact:  Sherie Don, LCSW Phone Number: 07/17/2022, 10:21 AM  Clinical Narrative: Patient is here through [employer] CSW met with patient to confirm discharge needs have been addressed. Per patient, he is already set up with OPPT at [facility] in [place] with his first appointment scheduled for 07/22/22. Patient has an older rolling walker at home and he was given another order for a rolling walker to use at a DME store, such as [employer] ([employer]). RN notified there are no TOC needs to set up for this patient at this time. TOC signing off.    Final next level of care: OP Rehab Barriers to Discharge: No Barriers Identified  Patient Goals and CMS Choice Patient states their goals for this hospitalization and ongoing recovery are:: Discharge home with OPPT at [place] offered to / list presented to : NA  Discharge Plan and Services         DME Arranged: N/A DME Agency: NA  Readmission Risk Interventions     No data to display

## 2022-07-17 NOTE — Evaluation (Signed)
Physical Therapy Evaluation Patient Details Name: Anthony Quinn MRN: [id number] DOB: June 24, 1961 Today's Date: 07/17/2022  History of Present Illness  61 yo male s/p L TKA 07/17/22. Hx of Afib, PE, neuropathy, DVT  Clinical Impression  On eval, pt was Min guard A for mobility. He walked ~75 feet with a RW. Moderate pain with activity. Reviewed/practiced exercises, gait training, and stair training. Encouraged pt to try to ambulate every hour as tolerated and to perform quad sets and seated knee flexion exercises as tolerated throughout the day. All education completed.        Recommendations for follow up therapy are one component of a multi-disciplinary discharge planning process, led by the attending physician.  Recommendations may be updated based on patient status, additional functional criteria and insurance authorization.  Follow Up Recommendations Follow physician's recommendations for discharge plan and follow up therapies      Assistance Recommended at Discharge Intermittent Supervision/Assistance  Patient can return home with the following  A little help with walking and/or transfers;A little help with bathing/dressing/bathroom;Assistance with cooking/housework;Assist for transportation;Help with stairs or ramp for entrance    Equipment Recommendations None recommended by PT  Recommendations for Other Services       Functional Status Assessment Patient has had a recent decline in their functional status and demonstrates the ability to make significant improvements in function in a reasonable and predictable amount of time.     Precautions / Restrictions Precautions Precautions: Fall;Knee Restrictions Weight Bearing Restrictions: No LLE Weight Bearing: Weight bearing as tolerated      Mobility  Bed Mobility Overal bed mobility: Modified Independent                  Transfers Overall transfer level: Needs assistance Equipment used: Rolling walker (2  wheels) Transfers: Sit to/from Stand Sit to Stand: Supervision           General transfer comment: Supv for safety.    Ambulation/Gait Ambulation/Gait assistance: Min guard Gait Distance (Feet): 75 Feet Assistive device: Rolling walker (2 wheels) Gait Pattern/deviations: Step-to pattern       General Gait Details: Cues for safety, technique, sequence. Pt mostly used step to pattern but he also intermittently used step thru pattern. Encouraged pt to try to put as much weight thru his L LE as he could tolerate.  Stairs Stairs: Yes Stairs assistance: Min guard Stair Management: Step to pattern, Forwards, Two rails Number of Stairs: 2 General stair comments: up and over portable stairs x 1. cues for safety, technique, sequence. min guard for safety.  Wheelchair Mobility    Modified Rankin (Stroke Patients Only)       Balance Overall balance assessment: Mild deficits observed, not formally tested                                           Pertinent Vitals/Pain Pain Assessment Pain Assessment: 0-10 Pain Score: 6  Pain Location: L knee with activity Pain Descriptors / Indicators: Discomfort, Sore, Aching Pain Intervention(s): Limited activity within patient's tolerance, Monitored during session, Repositioned, Ice applied    Home Living Family/patient expects to be discharged to:: Private residence Living Arrangements: Spouse/significant other Available Help at Discharge: Family Type of Home: House Home Access: Stairs to enter Entrance Stairs-Rails: Doctor, general practice of Steps: 2   Home Layout: One level;Laundry or work area in [employer] Equipment: Agricultural consultant (2 wheels);Cane -  single point;Crutches      Prior Function Prior Level of Function : Independent/Modified Independent             Mobility Comments: using crutches prior to surgery       Hand Dominance        Extremity/Trunk Assessment   Upper  Extremity Assessment Upper Extremity Assessment: Overall WFL for tasks assessed    Lower Extremity Assessment Lower Extremity Assessment: Generalized weakness    Cervical / Trunk Assessment Cervical / Trunk Assessment: Normal  Communication   Communication: No difficulties  Cognition Arousal/Alertness: Awake/alert Behavior During Therapy: WFL for tasks assessed/performed Overall Cognitive Status: Within Functional Limits for tasks assessed                                          General Comments      Exercises Total Joint Exercises Ankle Circles/Pumps: AROM, Both, 10 reps Quad Sets: AROM, Both, 10 reps Hip ABduction/ADduction: AROM, Left, 10 reps Straight Leg Raises: AROM, Left, 10 reps Long Arc Quad: AROM, Left, 5 reps, Seated Knee Flexion: AROM, Left, 10 reps, Seated Goniometric ROM: ~10-70 degrees   Assessment/Plan    PT Assessment Patient needs continued PT services  PT Problem List Decreased strength;Decreased mobility;Decreased range of motion;Decreased activity tolerance;Decreased balance;Decreased knowledge of use of DME       PT Treatment Interventions DME instruction;Gait training;Therapeutic activities;Therapeutic exercise;Patient/family education;Stair training;Balance training;Functional mobility training    PT Goals (Current goals can be found in the Care Plan section)  Acute Rehab PT Goals Patient Stated Goal: home asap PT Goal Formulation: With patient Time For Goal Achievement: 07/31/22 Potential to Achieve Goals: Good    Frequency 7X/week     Co-evaluation               AM-PAC PT "6 Clicks" Mobility  Outcome Measure Help needed turning from your back to your side while in a flat bed without using bedrails?: A Little Help needed moving from lying on your back to sitting on the side of a flat bed without using bedrails?: A Little Help needed moving to and from a bed to a chair (including a wheelchair)?: A Little Help  needed standing up from a chair using your arms (e.g., wheelchair or bedside chair)?: A Little Help needed to walk in hospital room?: A Little Help needed climbing 3-5 steps with a railing? : A Little 6 Click Score: 18    End of Session Equipment Utilized During Treatment: Gait belt Activity Tolerance: Patient tolerated treatment well Patient left: in chair;with call bell/phone within reach   PT Visit Diagnosis: Pain;Difficulty in walking, not elsewhere classified (R26.2) Pain - Right/Left: Left Pain - part of body: Knee    Time: 4315-4008 PT Time Calculation (min) (ACUTE ONLY): 25 min   Charges:   PT Evaluation $PT Eval Low Complexity: 1 Low PT Treatments $Gait Training: 8-22 mins         Faye Ramsay, PT Acute Rehabilitation  Office: [phone] Pager: [phone]

## 2022-07-17 NOTE — Progress Notes (Signed)
   Subjective: 1 Day Post-Op Procedure(s) (LRB): TOTAL KNEE ARTHROPLASTY (Left) Patient reports pain as mild.   Patient seen in rounds with Dr. Charlann Boxer. Patient is well, and has had no acute complaints or problems. No acute events overnight. Foley catheter removed. Patient ambulated a few feet with PT. He wants to get home as soon as possible. We will start therapy today.   Objective: Vital signs in last 24 hours: Temp:  [97.8 F (36.6 C)-98.3 F (36.8 C)] 98.1 F (36.7 C) (09/06 0546) Pulse Rate:  [50-78] 57 (09/06 0546) Resp:  [10-20] 14 (09/06 0546) BP: (101-168)/(48-80) 117/58 (09/06 0546) SpO2:  [90 %-100 %] 90 % (09/06 0546) Weight:  [93 kg] 93 kg (09/05 2012)  Intake/Output from previous day:  Intake/Output Summary (Last 24 hours) at 07/17/2022 0758 Last data filed at 07/17/2022 0600 Gross per 24 hour  Intake 2630.74 ml  Output 560 ml  Net 2070.74 ml     Intake/Output this shift: No intake/output data recorded.  Labs: Recent Labs    07/17/22 0400  HGB 13.3   Recent Labs    07/17/22 0400  WBC 14.7*  RBC 4.29  HCT 39.6  PLT 124*   Recent Labs    07/17/22 0400  NA 138  K 4.3  CL 103  CO2 27  BUN 24*  CREATININE 1.13  GLUCOSE 164*  CALCIUM 8.7*   No results for input(s): "LABPT", "INR" in the last 72 hours.  Exam: General - Patient is Alert and Oriented Extremity - Neurologically intact Sensation intact distally Intact pulses distally Dorsiflexion/Plantar flexion intact Dressing - dressing C/D/I Motor Function - intact, moving foot and toes well on exam.   Past Medical History:  Diagnosis Date   Anginal pain (HCC)    Anxiety    Arthritis    Atrial fibrillation (HCC)    DVT (deep venous thrombosis) (HCC)    GERD (gastroesophageal reflux disease)    Heart murmur    Hypertension    Neuropathy    PE (pulmonary thromboembolism) (HCC)     Assessment/Plan: 1 Day Post-Op Procedure(s) (LRB): TOTAL KNEE ARTHROPLASTY (Left) Principal Problem:    S/P total knee arthroplasty, left  Estimated body mass index is 29.42 kg/m as calculated from the following:   Height as of this encounter: 5\' 10"  (1.778 m).   Weight as of this encounter: 93 kg. Advance diet Up with therapy D/C IV fluids   Patient's anticipated LOS is less than 2 midnights, meeting these requirements: - Younger than 39 - Lives within 1 hour of care - Has a competent adult at home to recover with post-op recover - NO history of  - Chronic pain requiring opiods  - Diabetes  - Coronary Artery Disease  - Heart failure  - Heart attack  - Stroke  - DVT/VTE  - Cardiac arrhythmia  - Respiratory Failure/COPD  - Renal failure  - Anemia  - Advanced Liver disease     DVT Prophylaxis -  Eliquis Weight bearing as tolerated.  Hgb stable at 13.3 this AM.  Plan is to go Home after hospital stay. Plan for discharge today following 1-2 sessions of PT as long as they are meeting their goals. Patient is scheduled for OPPT. Follow up in the office in 2 weeks.   77, PA-C Orthopedic Surgery [phone] 07/17/2022, 7:58 AM

## 2022-07-17 NOTE — Plan of Care (Signed)

## 2022-07-17 NOTE — Progress Notes (Signed)
Provided discharge education/instructions, all questions and concerns addressed. Pt not in acute distress. Pt to discharge home with belongings accompanied by family. °

## 2022-08-01 NOTE — Discharge Summary (Signed)
Patient ID: Anthony Quinn MRN: [id number] DOB/AGE: [birth year] 61 y.o.  Admit date: 07/16/2022 Discharge date: 07/17/2022  Admission Diagnoses:  Left knee osteoarthritis  Discharge Diagnoses:  Principal Problem:   S/P total knee arthroplasty, left   Past Medical History:  Diagnosis Date   Anginal pain ([place])    Anxiety    Arthritis    Atrial fibrillation ([place])    DVT (deep venous thrombosis) ([facility])    GERD (gastroesophageal reflux disease)    Heart murmur    Hypertension    Neuropathy    PE (pulmonary thromboembolism) ([place])     Surgeries: Procedure(s): TOTAL KNEE ARTHROPLASTY on 07/16/2022   Consultants:   Discharged Condition: Improved  Hospital Course: Anthony Quinn is an 61 y.o. male who was admitted 07/16/2022 for operative treatment ofS/P total knee arthroplasty, left. Patient has severe unremitting pain that affects sleep, daily activities, and work/hobbies. After pre-op clearance the patient was taken to the operating room on 07/16/2022 and underwent  Procedure(s): TOTAL KNEE ARTHROPLASTY.    Patient was given perioperative antibiotics:  Anti-infectives (From admission, onward)    Start     Dose/Rate Route Frequency Ordered Stop   07/16/22 2200  ceFAZolin (ANCEF) IVPB 2g/100 mL premix        2 g 200 mL/hr over 30 Minutes Intravenous Every 6 hours 07/16/22 2013 07/17/22 0953   07/16/22 1445  ceFAZolin (ANCEF) IVPB 2g/100 mL premix        2 g 200 mL/hr over 30 Minutes Intravenous On call to O.R. 07/16/22 1429 07/16/22 1626        Patient was given sequential compression devices, early ambulation, and chemoprophylaxis to prevent DVT. Patient worked with PT and was meeting their goals regarding safe ambulation and transfers.  Patient benefited maximally from hospital stay and there were no complications.    Recent vital signs: No data found.   Recent laboratory studies: No results for input(s): "WBC", "HGB", "HCT", "PLT", "NA", "K", "CL", "CO2", "BUN", "CREATININE",  "GLUCOSE", "INR", "CALCIUM" in the last 72 hours.  Invalid input(s): "PT", "2"   Discharge Medications:   Allergies as of 07/17/2022       Reactions   Bee Venom Swelling   Penicillins Other (See Comments)   Childhood reaction. Unknown  Tolerated Cephalosporin Date: 07/16/22.        Medication List     STOP taking these medications    enoxaparin 150 MG/ML injection Commonly known as: Lovenox   ibuprofen 200 MG tablet Commonly known as: ADVIL   NAPROXEN PO       TAKE these medications    acetaminophen 500 MG tablet Commonly known as: TYLENOL Take 2 tablets (1,000 mg total) by mouth every 6 (six) hours. Notes to patient: Last dose given 09/06 06:13am   Adderall XR 15 MG 24 hr capsule Generic drug: amphetamine-dextroamphetamine Take 15 mg by mouth every morning.   aspirin EC 81 MG tablet Take 81 mg by mouth daily. Notes to patient: Resume home regimen   Depo-Testosterone 200 MG/ML injection Generic drug: testosterone cypionate Inject 200 mg into the muscle once a week. Notes to patient: Resume home regimen   divalproex 250 MG 24 hr tablet Commonly known as: DEPAKOTE ER Take 250 mg by mouth daily. Notes to patient: Resume home regimen   docusate sodium 100 MG capsule Commonly known as: COLACE Take 1 capsule (100 mg total) by mouth 2 (two) times daily.   Eliquis 5 MG Tabs tablet Generic drug: apixaban Take 5 mg by mouth 2 (  two) times daily.   EMERGEN-C ELECTRO MIX PO Take 1 packet by mouth daily. Notes to patient: Resume home regimen   lisinopril 20 MG tablet Commonly known as: ZESTRIL Take 20 mg by mouth 2 (two) times daily.   methocarbamol 500 MG tablet Commonly known as: ROBAXIN Take 1 tablet (500 mg total) by mouth every 6 (six) hours as needed for muscle spasms.   metoprolol tartrate 25 MG tablet Commonly known as: LOPRESSOR Take 1 tablet (25 mg total) by mouth 2 (two) times daily.   MULTIVITAMIN PO Take 1 packet by mouth daily. Notes to  patient: Resume home regimen   NASAL SPRAY NA Place 1 spray into the nose as needed (congestion). Notes to patient: Resume home regimen   oxyCODONE 5 MG immediate release tablet Commonly known as: Oxy IR/ROXICODONE Take 1-2 tablets (5-10 mg total) by mouth every 4 (four) hours as needed for severe pain. Start with 1 tablet every 4 hours. Only take 2 tablets for severe pain. Notes to patient: Last dose given 09/06 08:28am   polyethylene glycol 17 g packet Commonly known as: MIRALAX / GLYCOLAX Take 17 g by mouth daily as needed for mild constipation.   sildenafil 100 MG tablet Commonly known as: VIAGRA Take 50 mg by mouth as needed for erectile dysfunction. Notes to patient: Resume home regimen   VITAMIN D PO Take 1 capsule by mouth daily. Notes to patient: Resume home regimen               Discharge Care Instructions  (From admission, onward)           Start     Ordered   07/17/22 0000  Change dressing       Comments: Maintain surgical dressing until follow up in the clinic. If the edges start to pull up, may reinforce with tape. If the dressing is no longer working, may remove and cover with gauze and tape, but must keep the area dry and clean.  Call with any questions or concerns.   07/17/22 0804            Diagnostic Studies: No results found.  Disposition: Discharge disposition: 01-Home or Self Care       Discharge Instructions     Call MD / Call 911   Complete by: As directed    If you experience chest pain or shortness of breath, CALL 911 and be transported to the hospital emergency room.  If you develope a fever above 101 F, pus (white drainage) or increased drainage or redness at the wound, or calf pain, call your surgeon's office.   Change dressing   Complete by: As directed    Maintain surgical dressing until follow up in the clinic. If the edges start to pull up, may reinforce with tape. If the dressing is no longer working, may remove and  cover with gauze and tape, but must keep the area dry and clean.  Call with any questions or concerns.   Constipation Prevention   Complete by: As directed    Drink plenty of fluids.  Prune juice may be helpful.  You may use a stool softener, such as Colace (over the counter) 100 mg twice a day.  Use MiraLax (over the counter) for constipation as needed.   Diet - low sodium heart healthy   Complete by: As directed    Increase activity slowly as tolerated   Complete by: As directed    Weight bearing as tolerated with assist device (  walker, cane, etc) as directed, use it as long as suggested by your surgeon or therapist, typically at least 4-6 weeks.   Post-operative opioid taper instructions:   Complete by: As directed    POST-OPERATIVE OPIOID TAPER INSTRUCTIONS: It is important to wean off of your opioid medication as soon as possible. If you do not need pain medication after your surgery it is ok to stop day one. Opioids include: Codeine, Hydrocodone(Norco, Vicodin), Oxycodone(Percocet, oxycontin) and hydromorphone amongst others.  Long term and even short term use of opiods can cause: Increased pain response Dependence Constipation Depression Respiratory depression And more.  Withdrawal symptoms can include Flu like symptoms Nausea, vomiting And more Techniques to manage these symptoms Hydrate well Eat regular healthy meals Stay active Use relaxation techniques(deep breathing, meditating, yoga) Do Not substitute Alcohol to help with tapering If you have been on opioids for less than two weeks and do not have pain than it is ok to stop all together.  Plan to wean off of opioids This plan should start within one week post op of your joint replacement. Maintain the same interval or time between taking each dose and first decrease the dose.  Cut the total daily intake of opioids by one tablet each day Next start to increase the time between doses. The last dose that should be  eliminated is the evening dose.      TED hose   Complete by: As directed    Use stockings (TED hose) for 2 weeks on both leg(s).  You may remove them at night for sleeping.        Follow-up Information     Paralee Cancel, MD. Schedule an appointment as soon as possible for a visit in 2 week(s).   Specialty: Orthopedic Surgery Contact information: [address] [place] [address] [phone]                  Signed: Irving Copas 08/01/2022, 7:13 AM

## 2022-08-12 ENCOUNTER — Telehealth: Payer: Self-pay

## 2022-08-12 NOTE — Telephone Encounter (Signed)
New message  Received referral from [facility]  06/20/22 - left  voicemail   06/21/22 - mail letter   07/26/22 - left voicemail    Referral closed out - unable to contact

## 2023-05-06 ENCOUNTER — Other Ambulatory Visit: Payer: Self-pay | Admitting: Cardiovascular Disease

## 2023-08-18 ENCOUNTER — Other Ambulatory Visit: Payer: Self-pay | Admitting: Cardiovascular Disease

## 2023-09-10 ENCOUNTER — Other Ambulatory Visit: Payer: Self-pay | Admitting: Cardiovascular Disease

## 2023-11-28 IMAGING — DX DG CHEST 1V PORT
1 series · 1 of 1 positions shown · non-contrast
Comparison: None

CLINICAL DATA: Hypertension and tachycardia.  Atrial flutter.

EXAM:
PORTABLE CHEST 1 VIEW

[chest ap]
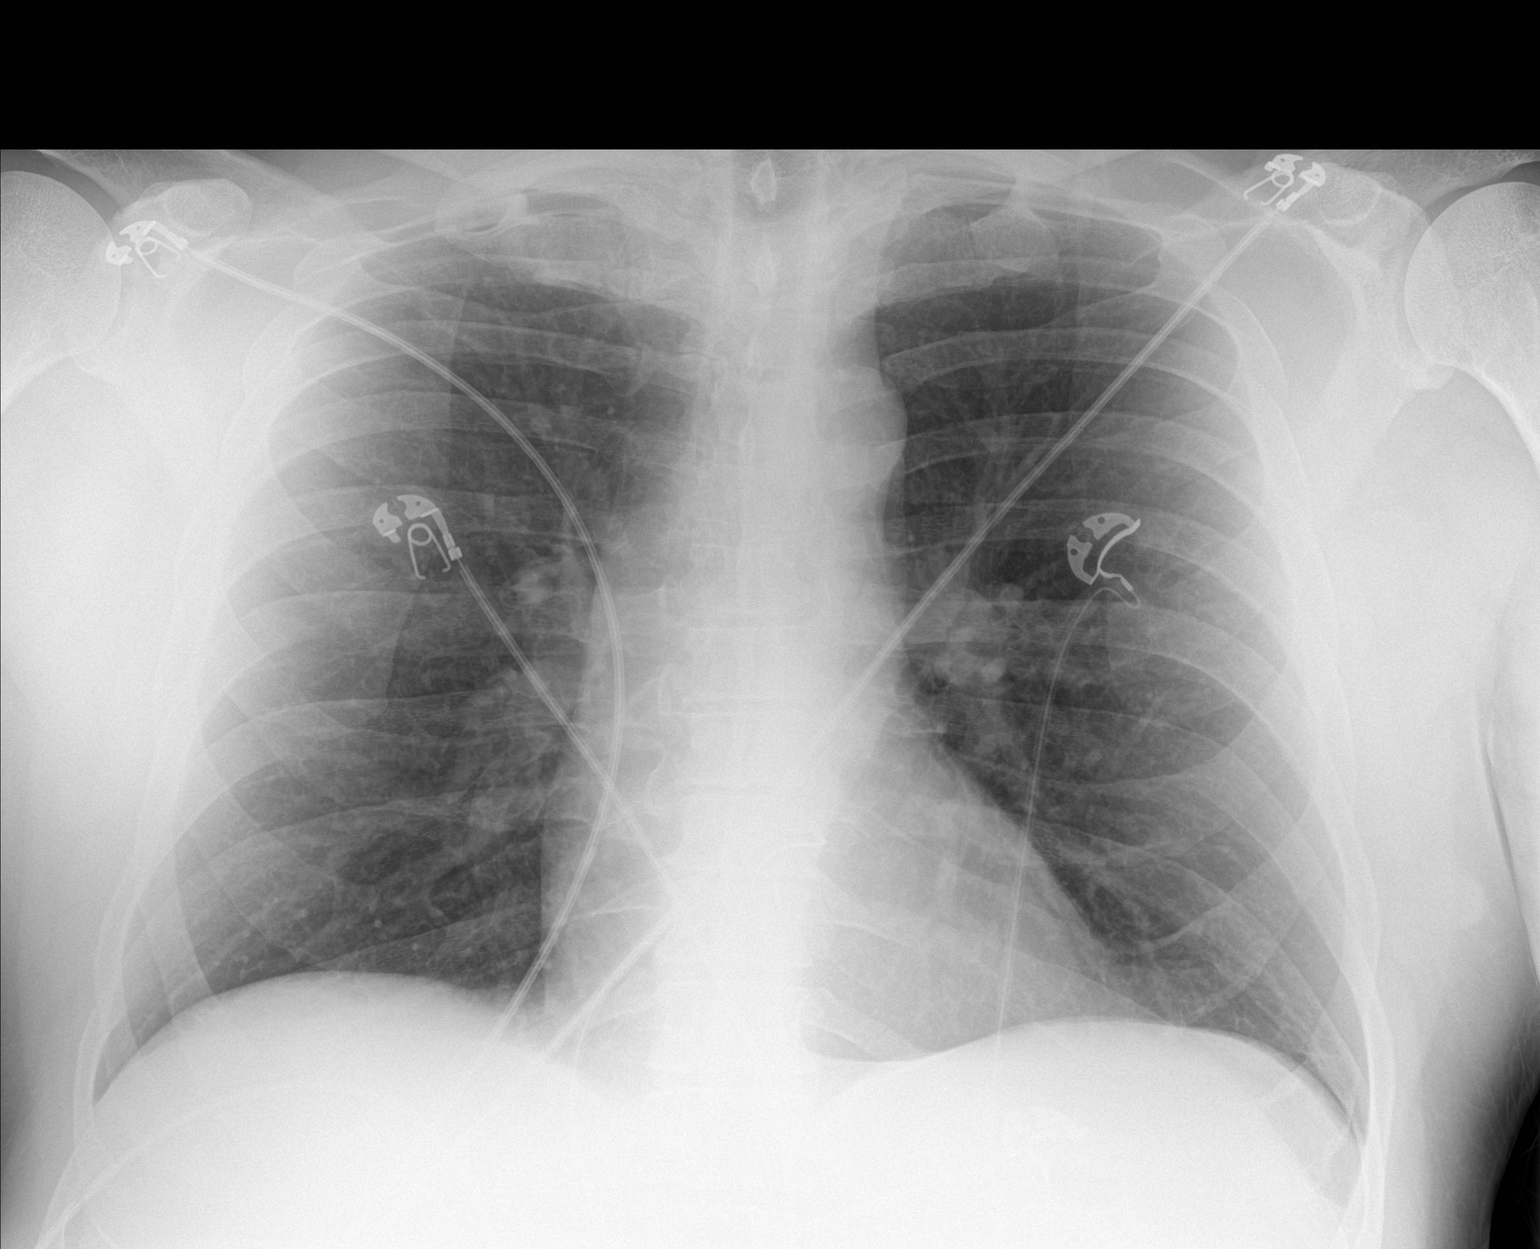

[1 of 1 positions shown; findings below may reference images not displayed]

FINDINGS: The heart size and mediastinal contours are within normal limits.
Both lungs are clear. The visualized skeletal structures are
unremarkable.
IMPRESSION: No active disease.
